# Patient Record
Sex: Female | Born: 1985 | Race: Black or African American | Hispanic: No | Marital: Single | Smoking: Never smoker
Health system: Southern US, Community
[De-identification: ages and names within clinical notes are randomized; demographics above are authoritative.]

## PROBLEM LIST (undated history)

## (undated) DIAGNOSIS — F909 Attention-deficit hyperactivity disorder, unspecified type: Secondary | ICD-10-CM

## (undated) DIAGNOSIS — Z8759 Personal history of other complications of pregnancy, childbirth and the puerperium: Secondary | ICD-10-CM

## (undated) DIAGNOSIS — J45909 Unspecified asthma, uncomplicated: Secondary | ICD-10-CM

## (undated) HISTORY — DX: Attention-deficit hyperactivity disorder, unspecified type: F90.9

## (undated) HISTORY — PX: TUMOR REMOVAL: SHX12

## (undated) HISTORY — PX: INDUCED ABORTION: SHX677

## (undated) HISTORY — DX: Unspecified asthma, uncomplicated: J45.909

## (undated) HISTORY — PX: DILATION AND CURETTAGE OF UTERUS: SHX78

## (undated) HISTORY — DX: Personal history of other complications of pregnancy, childbirth and the puerperium: Z87.59

---

## 2014-03-13 LAB — LIPID PANEL
Cholesterol: 186 mg/dL (ref 0–200)
HDL: 54 mg/dL (ref 35–70)
LDL CALC: 112 mg/dL

## 2014-03-13 LAB — CBC AND DIFFERENTIAL
HEMOGLOBIN: 12.2 g/dL (ref 12.0–16.0)
PLATELETS: 319 10*3/uL (ref 150–399)
WBC: 5.7 10*3/mL

## 2014-03-13 LAB — BASIC METABOLIC PANEL
Creatinine: 0.9 mg/dL (ref 0.5–1.1)
Glucose: 83 mg/dL
Potassium: 4.1 mmol/L (ref 3.4–5.3)
Sodium: 139 mmol/L (ref 137–147)

## 2014-03-13 LAB — HEPATIC FUNCTION PANEL
ALT: 20 U/L (ref 7–35)
AST: 11 U/L — AB (ref 13–35)

## 2015-01-14 LAB — CBC AND DIFFERENTIAL
HEMATOCRIT: 34 % — AB (ref 36–46)
Hemoglobin: 11.5 g/dL — AB (ref 12.0–16.0)
PLATELETS: 298 10*3/uL (ref 150–399)
WBC: 4.6 10^3/mL

## 2015-01-14 LAB — BASIC METABOLIC PANEL
CREATININE: 0.9 mg/dL (ref 0.5–1.1)
Glucose: 72 mg/dL

## 2015-11-12 ENCOUNTER — Encounter: Payer: Self-pay | Admitting: Osteopathic Medicine

## 2015-11-12 ENCOUNTER — Ambulatory Visit (INDEPENDENT_AMBULATORY_CARE_PROVIDER_SITE_OTHER): Payer: 59 | Admitting: Osteopathic Medicine

## 2015-11-12 VITALS — BP 127/69 | HR 64 | Ht 67.0 in | Wt 171.0 lb

## 2015-11-12 DIAGNOSIS — R233 Spontaneous ecchymoses: Secondary | ICD-10-CM

## 2015-11-12 DIAGNOSIS — Z8709 Personal history of other diseases of the respiratory system: Secondary | ICD-10-CM | POA: Diagnosis not present

## 2015-11-12 DIAGNOSIS — R238 Other skin changes: Secondary | ICD-10-CM

## 2015-11-12 DIAGNOSIS — N920 Excessive and frequent menstruation with regular cycle: Secondary | ICD-10-CM

## 2015-11-12 DIAGNOSIS — Z8659 Personal history of other mental and behavioral disorders: Secondary | ICD-10-CM | POA: Diagnosis not present

## 2015-11-12 DIAGNOSIS — Z113 Encounter for screening for infections with a predominantly sexual mode of transmission: Secondary | ICD-10-CM | POA: Diagnosis not present

## 2015-11-12 MED ORDER — LISDEXAMFETAMINE DIMESYLATE 60 MG PO CAPS: 60.0000 mg | ORAL_CAPSULE | ORAL | 0 refills | Status: DC

## 2015-11-12 MED ORDER — ALBUTEROL SULFATE HFA 108 (90 BASE) MCG/ACT IN AERS: 1.0000 | INHALATION_SPRAY | Freq: Four times a day (QID) | RESPIRATORY_TRACT | 6 refills | Status: DC | PRN

## 2015-11-12 MED ORDER — BUDESONIDE-FORMOTEROL FUMARATE 160-4.5 MCG/ACT IN AERO: 2.0000 | INHALATION_SPRAY | Freq: Two times a day (BID) | RESPIRATORY_TRACT | 6 refills | Status: DC

## 2015-11-12 NOTE — Progress Notes (Signed)
HPI: Hannah Martin is a 30 y.o. female  who presents to [place] today, 11/12/15,  for chief complaint of:  Chief Complaint  Patient presents with  . Establish Care      Easy bruising - stopped eating red meat, heavy regular periods.   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 [place] Controlled Substance Database reviewed. Has been on this for maybe a year and 3 months. Take it usually on work days, last dose was yesterday.     Past medical, surgical, social and family history reviewed: Past Medical History:  Diagnosis Date  . Asthma    History reviewed. No pertinent surgical history. Social History  Substance Use Topics  . Smoking status: Never Smoker  . Smokeless tobacco: Never Used  . Alcohol use Not on file   Family History  Problem Relation Age of Onset  . Diabetes Mother   . Anemia Mother   . Heart attack Maternal Grandmother      Current medication list and allergy/intolerance information reviewed:   Current Outpatient Prescriptions  Medication Sig Dispense Refill  . albuterol (PROVENTIL HFA;VENTOLIN HFA) 108 (90 Base) MCG/ACT inhaler Inhale into the lungs every 6 (six) hours as needed for wheezing or shortness of breath.    . budesonide-formoterol (SYMBICORT) 160-4.5 MCG/ACT inhaler Inhale 2 puffs into the lungs 2 (two) times daily.    Marland Kitchen lisdexamfetamine (VYVANSE) 60 MG capsule Take 60 mg by mouth every morning.     No current facility-administered medications for this visit.    No Known Allergies    Review of Systems:  Constitutional:  No  fever, no chills, No recent illness, No unintentional weight changes. No significant fatigue.   HEENT: No  headache, no vision change, no hearing change, No sore throat, No  sinus pressure  Cardiac: No  chest pain, No  pressure, No palpitations, No  Orthopnea  Respiratory:  No  shortness of breath. No   Cough  Gastrointestinal: No  abdominal pain, No  nausea, No  vomiting,  No  blood in stool, No  diarrhea, No  constipation   Musculoskeletal: No new myalgia/arthralgia  Genitourinary: No  incontinence, No  abnormal genital bleeding, No abnormal genital discharge  Skin: No  Rash, No other wounds/concerning lesions  Hem/Onc: No  easy bruising/bleeding, No  abnormal lymph node  Endocrine: No cold intolerance,  No heat intolerance. No polyuria/polydipsia/polyphagia   Neurologic: No  weakness, No  dizziness, No  slurred speech/focal weakness/facial droop  Psychiatric: No  concerns with depression, No  concerns with anxiety, No sleep problems, No mood problems  Exam:  BP 127/69   Pulse 64   Ht '5\' 7"'  (1.702 m)   Wt 171 lb (77.6 kg)   BMI 26.78 kg/m   Constitutional: VS see above. General Appearance: alert, well-developed, well-nourished, NAD  Eyes: Normal lids and conjunctive, non-icteric sclera  Ears, Nose, Mouth, Throat: MMM, Normal external inspection ears/nares/mouth/lips/gums. TM normal bilaterally. Pharynx/tonsils no erythema, no exudate. Nasal mucosa normal.   Neck: No masses, trachea midline. No thyroid enlargement. No tenderness/mass appreciated. No lymphadenopathy  Respiratory: Normal respiratory effort. no wheeze, no rhonchi, no rales  Cardiovascular: S1/S2 normal, no murmur, no rub/gallop auscultated. RRR. No lower extremity edema. Pedal pulse II/IV bilaterally DP and PT. No carotid bruit or JVD. No abdominal aortic bruit.  Gastrointestinal: Nontender, no masses. No hepatomegaly, no splenomegaly. No hernia appreciated. Bowel sounds normal. Rectal exam  deferred.   Musculoskeletal: Gait normal. No clubbing/cyanosis of digits.   Neurological: No cranial nerve deficit on limited exam. Motor and sensation intact and symmetric. Cerebellar reflexes intact. Normal balance/coordination. No tremor.   Skin: warm, dry, intact. No rash/ulcer. No concerning nevi or subq nodules on  limited exam.    Psychiatric: Normal judgment/insight. Normal mood and affect. Oriented x3.    Results for orders placed or performed in visit on 11/12/15 (from the past 72 hour(s))  GC/Chlamydia Probe Amp     Status: None   Collection Time: 11/12/15  3:23 PM  Result Value Ref Range   CT Probe RNA NOT DETECTED     Comment:                    **Normal Reference Range: NOT DETECTED**   This test was performed using the APTIMA COMBO2 Assay ([place].).   The analytical performance characteristics of this assay, when used to test SurePath specimens have been determined by [facility]      GC Probe RNA NOT DETECTED     Comment:                    **Normal Reference Range: NOT DETECTED**   This test was performed using the APTIMA COMBO2 Assay ([place].).   The analytical performance characteristics of this assay, when used to test SurePath specimens have been determined by [facility]     CBC with Differential/Platelet     Status: None   Collection Time: 11/12/15  3:25 PM  Result Value Ref Range   WBC 4.0 3.8 - 10.8 K/uL   RBC 4.37 3.80 - 5.10 MIL/uL   Hemoglobin 12.5 11.7 - 15.5 g/dL   HCT 37.6 35.0 - 45.0 %   MCV 86.0 80.0 - 100.0 fL   MCH 28.6 27.0 - 33.0 pg   MCHC 33.2 32.0 - 36.0 g/dL   RDW 13.3 11.0 - 15.0 %   Platelets 295 140 - 400 K/uL   MPV 9.9 7.5 - 12.5 fL   Neutro Abs 1,520 1,500 - 7,800 cells/uL   Lymphs Abs 1,920 850 - 3,900 cells/uL   Monocytes Absolute 360 200 - 950 cells/uL   Eosinophils Absolute 160 15 - 500 cells/uL   Basophils Absolute 40 0 - 200 cells/uL   Neutrophils Relative % 38 %   Monocytes Relative 9 %   Eosinophils Relative 4 %   Basophils Relative 1 %   Smear Review Criteria for review not met     Comment: ** Please note change in unit of measure and reference range(s). **  COMPLETE METABOLIC PANEL WITH GFR     Status: None   Collection Time: 11/12/15  3:25 PM  Result Value Ref Range   Sodium 139 135 - 146 mmol/L    Potassium 4.0 3.5 - 5.3 mmol/L   Chloride 108 98 - 110 mmol/L   CO2 23 20 - 31 mmol/L   Glucose, Bld 72 65 - 99 mg/dL   BUN 16 7 - 25 mg/dL   Creat 0.89 0.50 - 1.10 mg/dL   Total Bilirubin 0.9 0.2 - 1.2 mg/dL   Alkaline Phosphatase 46 33 - 115 U/L   AST 13 10 - 30 U/L   ALT 14 6 - 29 U/L   Total Protein 7.0 6.1 - 8.1 g/dL   Albumin 4.3 3.6 - 5.1 g/dL   Calcium 9.7 8.6 - 10.2 mg/dL   GFR, Est African American >89 >=60 mL/min  GFR, Est Non African American 88 >=60 mL/min  TSH     Status: None   Collection Time: 11/12/15  3:25 PM  Result Value Ref Range   TSH 0.44 mIU/L    Comment:   Reference Range   > or = 20 Years  0.40-4.50   Pregnancy Range First trimester  0.26-2.66 Second trimester 0.55-2.73 Third trimester  0.43-2.91     HIV antibody     Status: None   Collection Time: 11/12/15  3:25 PM  Result Value Ref Range   HIV 1&2 Ab, 4th Generation NONREACTIVE NONREACTIVE    Comment:   HIV-1 antigen and HIV-1/HIV-2 antibodies were not detected.  There is no laboratory evidence of HIV infection.   HIV-1/2 Antibody Diff        Not indicated. HIV-1 RNA, Qual TMA          Not indicated.     PLEASE NOTE: This information has been disclosed to you from records whose confidentiality may be protected by state law. If your state requires such protection, then the state law prohibits you from making any further disclosure of the information without the specific written consent of the person to whom it pertains, or as otherwise permitted by law. A general authorization for the release of medical or other information is NOT sufficient for this purpose.   The performance of this assay has not been clinically validated in patients less than 80 years old.   For additional information please refer to http://education.[facility].com/faq/FAQ106.  (This link is being provided for informational/educational purposes only.)     RPR     Status: None   Collection Time: 11/12/15   3:25 PM  Result Value Ref Range   RPR Ser Ql NON REAC NON REAC  Protime-INR     Status: None   Collection Time: 11/12/15  3:25 PM  Result Value Ref Range   Prothrombin Time 10.7 9.0 - 11.5 sec    Comment:   For more information on this test, go to: http://education.[facility].com/faq/FAQ104      INR 1.0     Comment:   Reference Range                        0.9-1.1 Moderate-intensity Warfarin Therapy    2.0-3.0 Higher-intensity Warfarin Therapy      3.0-4.0     PTT     Status: None   Collection Time: 11/12/15  3:25 PM  Result Value Ref Range   aPTT 31 22 - 34 sec    Comment:   This test has not been validated for monitoring unfractionated heparin therapy. For testing that is validated for this type of therapy, please refer to the Heparin Anti-Xa assay (test code [phone]).   For additional information, please refer to http://education.[facility].com/faq/FAQ159 (This link is being provided for informational/educational purposes only.)     Trichomonas vaginalis, RNA     Status: None   Collection Time: 11/12/15  4:13 PM  Result Value Ref Range   T vaginalis RNA Not Detected     Comment:                   ** Normal Reference Range: Not Detected **   Assay performed by the FDA Approved GenProbe APTIMA Trichomonas vaginalis kit.   Effective November 04, 2015, the general Trichomonas test code [id number] (Trichomonas vaginalis, RNA) will be inactivated. Please refer to the following new codes when ordering Trichomonas testing:   - 89169 (SureSwab,  T. vaginalis, RNA, Female) for female collections - [phone] (Trichomonas vaginalis, RNA, Female) for female collections - [phone] (T. vaginalis RNA, PAP Vial) for PAP vial collections   All new test codes are currently available for use. If you have any questions, please contact your [facility] Account Representative directly, or call our Customer Service Department at [phone].       No results  found.   ASSESSMENT/PLAN:   History of ADHD - Plan: lisdexamfetamine (VYVANSE) 60 MG capsule  History of asthma - Plan: budesonide-formoterol (SYMBICORT) 160-4.5 MCG/ACT inhaler, albuterol (PROVENTIL HFA;VENTOLIN HFA) 108 (90 Base) MCG/ACT inhaler  Routine screening for STI (sexually transmitted infection) - Plan: HIV antibody, RPR, GC/Chlamydia Probe Amp, Trichomonas vaginalis, RNA, CANCELED: GC/chlamydia probe amp, urine, CANCELED: Trichomonas vaginalis, RNA  Easy bruising - Plan: COMPLETE METABOLIC PANEL WITH GFR, Protime-INR, PTT  Menorrhagia with regular cycle - Plan: CBC with Differential/Platelet, TSH     Visit summary with medication list and pertinent instructions was printed for patient to review. All questions at time of visit were answered - patient instructed to contact office with any additional concerns. ER/RTC precautions were reviewed with the patient. Follow-up plan: Return in about 3 months (around 02/12/2016), or sooner if needed, for [place] - please call ahead and make sure we have received your records.

## 2015-11-12 NOTE — Patient Instructions (Addendum)
Heavy periods - try OTC Ibuprofen 400 - 800 mg three times per day, or Aleve 220 mg twice per day, starting a day or two before your period, and continuing until bleeding gets light.   Asthma - if you get winded during exercise, try Albuterol inhaler a few minutes before your workout.     DR. Mardelle MatteALEXANDER'S IMPORTANT  INFORMATION FOR NEW PATIENTS!  PLEASE REVIEW CAREFULLY!    FOLLOW-UP!   Let's plan to follow-up in the office at your convenience for annual physical / wellnes check once I have previous doctor's records.   Long term, let's plan to follow-up every 3-4 months for management/monitoring of chronic medical issues such as ADD and Asthma, as well as management of your prescription medications.   Please don't hesitate to make an appointment sooner if you're having any acute concerns or problems!  REGARDING PRESCRIPTION MEDICATIONS  Please let your pharmacy know when you are running low on medications/refills (do not wait until you are out of medicines). Your pharmacy will send our office a request for the appropriate medications. Please allow our office 2-3 business days to process the needed refills.   For controlled substances, please read the following carefully:  You may be asked to schedule an office visit and/or undergo a random urine drug screen for continuation of such medications.   You may be asked to come to the office to pick up refills once per month. We are not able to fax or give automatic refills on many of these types of prescriptions.   If you have signed a controlled substance contract, you are responsible for adhering to the rules of that contract, and you have been provided with a copy of that contract.   REGARDING ANY CARE YOU RECEIVE OUTSIDE OUR OFFICE  At any visits to any specialists, or if you receive vaccines anywhere outside our office, please provide our clinic information so that they can forward us any records, including any tests which are done,  changes to your medications, or new vaccinations.   Also, if you are ever treated in an emergency room or if you are admitted to the hospital, please contact our office after your discharge. We encourage our patients to schedule a follow-up visit with their PCP any time they are treated for a serious illness or injury.   This allows all your physicians to communicate effectively, putting your primary care doctor at the center of your medical care and allowing us to effectively coordinate your care.  REGARDING PREVENTIVE CARE & WELLNESS, AKA "ANNUAL PHYSICAL"  Let's plan to follow-up here in the office in 3 - 4 months for ANNUAL WELLNESS & PREVENTIVE CARE PHYSICAL.   This visit is very important to make sure we talk with our patients about all recommended cancer screenings, vaccines, birth control (if desired) and screenings for other diseases based on your personal/family history. Plus, this visit should be completely covered under your insurance!   We also like to talk about any changes to your health over the past year and make sure any chronic conditions are well-controlled.    If you would like to, you can get routine lab work done 2 - 3 days before your physical so that we can go over the results in person at your appointment. Please call our office a week before that appointment so we can make sure the lab has the appropriate orders for your blood draw.   Please note: insurance should completely cover one preventive care visit annually,  and they should completely cover most tests associated with preventive care such as routine labs, mammograms, etc. If you have any other medical concerns to address, you may be asked to reschedule your annual physical, or schedule a separate visit to address other medical concerns. Or, you may be billed for care related to "problem-based visit" in addition to your "preventive care visit." If you have questions about this, please contact our office, your  insurance company, or [facility] billing department.   REGARDING ANY FORMS NEEDING YOUR DOCTOR'S SIGNATURE  If you ever have any paperwork which needs to be completed by your PCP, you may be asked to come to the office if that paperwork requires a complex review of your medical history - we want to make sure everything is completed to your satisfaction and is completed correctly the first time, particularly with FMLA or other employment/legal matters!    Please let us know if there is anything else we can do for you. Take care! -Dr. Mervyn SkeetersA.

## 2015-11-13 DIAGNOSIS — R238 Other skin changes: Secondary | ICD-10-CM | POA: Insufficient documentation

## 2015-11-13 DIAGNOSIS — R233 Spontaneous ecchymoses: Secondary | ICD-10-CM | POA: Insufficient documentation

## 2015-11-13 DIAGNOSIS — N920 Excessive and frequent menstruation with regular cycle: Secondary | ICD-10-CM | POA: Insufficient documentation

## 2015-11-13 DIAGNOSIS — Z113 Encounter for screening for infections with a predominantly sexual mode of transmission: Secondary | ICD-10-CM | POA: Insufficient documentation

## 2015-11-13 DIAGNOSIS — Z0000 Encounter for general adult medical examination without abnormal findings: Secondary | ICD-10-CM | POA: Insufficient documentation

## 2015-11-13 LAB — CBC WITH DIFFERENTIAL/PLATELET
Basophils Absolute: 40 {cells}/uL (ref 0–200)
Basophils Relative: 1 %
Eosinophils Absolute: 160 {cells}/uL (ref 15–500)
Eosinophils Relative: 4 %
HCT: 37.6 % (ref 35.0–45.0)
Hemoglobin: 12.5 g/dL (ref 11.7–15.5)
Lymphs Abs: 1920 {cells}/uL (ref 850–3900)
MCH: 28.6 pg (ref 27.0–33.0)
MCHC: 33.2 g/dL (ref 32.0–36.0)
MCV: 86 fL (ref 80.0–100.0)
MPV: 9.9 fL (ref 7.5–12.5)
Monocytes Absolute: 360 {cells}/uL (ref 200–950)
Monocytes Relative: 9 %
Neutro Abs: 1520 {cells}/uL (ref 1500–7800)
Neutrophils Relative %: 38 %
Platelets: 295 K/uL (ref 140–400)
RBC: 4.37 MIL/uL (ref 3.80–5.10)
RDW: 13.3 % (ref 11.0–15.0)
WBC: 4 K/uL (ref 3.8–10.8)

## 2015-11-13 LAB — COMPLETE METABOLIC PANEL WITHOUT GFR
ALT: 14 U/L (ref 6–29)
AST: 13 U/L (ref 10–30)
Albumin: 4.3 g/dL (ref 3.6–5.1)
Alkaline Phosphatase: 46 U/L (ref 33–115)
BUN: 16 mg/dL (ref 7–25)
CO2: 23 mmol/L (ref 20–31)
Calcium: 9.7 mg/dL (ref 8.6–10.2)
Chloride: 108 mmol/L (ref 98–110)
Creat: 0.89 mg/dL (ref 0.50–1.10)
GFR, Est African American: >89 mL/min
GFR, Est Non African American: 88 mL/min
Glucose, Bld: 72 mg/dL (ref 65–99)
Potassium: 4 mmol/L (ref 3.5–5.3)
Sodium: 139 mmol/L (ref 135–146)
Total Bilirubin: 0.9 mg/dL (ref 0.2–1.2)
Total Protein: 7 g/dL (ref 6.1–8.1)

## 2015-11-13 LAB — GC/CHLAMYDIA PROBE AMP
CT Probe RNA: NOT DETECTED
GC PROBE AMP APTIMA: NOT DETECTED

## 2015-11-13 LAB — HIV ANTIBODY (ROUTINE TESTING W REFLEX): HIV: NONREACTIVE

## 2015-11-13 LAB — TRICHOMONAS VAGINALIS, PROBE AMP: T vaginalis RNA: NOT DETECTED

## 2015-11-13 LAB — PROTIME-INR
INR: 1
PROTHROMBIN TIME: 10.7 s (ref 9.0–11.5)

## 2015-11-13 LAB — SYPHILIS: RPR W/REFLEX TO RPR TITER AND TREPONEMAL ANTIBODIES, TRADITIONAL SCREENING AND DIAGNOSIS ALGORITHM

## 2015-11-13 LAB — APTT: aPTT: 31 s (ref 22–34)

## 2015-11-13 LAB — TSH: TSH: 0.44 m[IU]/L

## 2015-11-13 NOTE — Assessment & Plan Note (Signed)
Contract signed and sent to scan

## 2015-12-26 ENCOUNTER — Other Ambulatory Visit: Payer: Self-pay

## 2015-12-26 DIAGNOSIS — Z8659 Personal history of other mental and behavioral disorders: Secondary | ICD-10-CM

## 2015-12-26 MED ORDER — LISDEXAMFETAMINE DIMESYLATE 60 MG PO CAPS: 60.0000 mg | ORAL_CAPSULE | ORAL | 0 refills | Status: DC

## 2015-12-30 ENCOUNTER — Encounter: Payer: Self-pay | Admitting: Osteopathic Medicine

## 2016-01-31 ENCOUNTER — Other Ambulatory Visit: Payer: Self-pay

## 2016-01-31 DIAGNOSIS — Z8659 Personal history of other mental and behavioral disorders: Secondary | ICD-10-CM

## 2016-01-31 MED ORDER — LISDEXAMFETAMINE DIMESYLATE 60 MG PO CAPS: 60.0000 mg | ORAL_CAPSULE | ORAL | 0 refills | Status: DC

## 2016-03-16 ENCOUNTER — Ambulatory Visit: Payer: 59 | Admitting: Osteopathic Medicine

## 2016-03-18 ENCOUNTER — Encounter: Payer: Self-pay | Admitting: Osteopathic Medicine

## 2016-03-18 ENCOUNTER — Ambulatory Visit (INDEPENDENT_AMBULATORY_CARE_PROVIDER_SITE_OTHER): Payer: 59 | Admitting: Osteopathic Medicine

## 2016-03-18 DIAGNOSIS — Z8659 Personal history of other mental and behavioral disorders: Secondary | ICD-10-CM

## 2016-03-18 MED ORDER — MONTELUKAST SODIUM 10 MG PO TABS: 10.0000 mg | ORAL_TABLET | Freq: Every day | ORAL | 11 refills | Status: DC

## 2016-03-18 MED ORDER — LISDEXAMFETAMINE DIMESYLATE 60 MG PO CAPS: 60.0000 mg | ORAL_CAPSULE | ORAL | 0 refills | Status: DC

## 2016-03-18 NOTE — Progress Notes (Signed)
HPI: Hannah Martin is a 30 y.o. female  who presents [facility] today, 03/18/16,  for chief complaint of:  Chief Complaint  Patient presents with  . Follow-up    ADHD    Requests refill Rx for Vyvanse.. [place] Controlled Substance Database reviewed. Consistent with monthly refills from me. No concerns on medications as far as palpitations, insomnia, unintentional weight changes   Past medical, surgical, social and family history reviewed: Past Medical History:  Diagnosis Date  . Asthma    No past surgical history on file. Social History  Substance Use Topics  . Smoking status: Never Smoker  . Smokeless tobacco: Never Used  . Alcohol use Not on file   Family History  Problem Relation Age of Onset  . Diabetes Mother   . Anemia Mother   . Heart attack Maternal Grandmother      Current medication list and allergy/intolerance information reviewed:   Current Outpatient Prescriptions  Medication Sig Dispense Refill  . albuterol (PROVENTIL HFA;VENTOLIN HFA) 108 (90 Base) MCG/ACT inhaler Inhale 1-2 puffs into the lungs every 6 (six) hours as needed for wheezing or shortness of breath. 1 Inhaler 6  . budesonide-formoterol (SYMBICORT) 160-4.5 MCG/ACT inhaler Inhale 2 puffs into the lungs 2 (two) times daily. During severe allergy season or acute illness 1 Inhaler 6  . lisdexamfetamine (VYVANSE) 60 MG capsule Take 1 capsule (60 mg total) by mouth every morning. 30 capsule 0   No current facility-administered medications for this visit.    No Known Allergies    Review of Systems:  Constitutional:  No  fever, no chills, No recent illness, No unintentional weight changes.   HEENT: No  headache  Cardiac: No  chest pain, No  pressure, No palpitations  Respiratory:  No  shortness of breath.  Neurologic: No  weakness, No  dizziness,  Psychiatric: No  concerns with depression, No  concerns with anxiety, No sleep problems, No mood  problems  Exam:  BP 132/71   Pulse 83   Ht 5' 6.5" (1.689 m)   Wt 171 lb (77.6 kg)   BMI 27.19 kg/m   Constitutional: VS see above. General Appearance: alert, well-developed, well-nourished, NAD  Neck: No masses, trachea midline.  Respiratory: Normal respiratory effort.  Cardiovascular: S1/S2 normal, no murmur, no rub/gallop auscultated. RRR  Psychiatric: Normal judgment/insight. Normal mood and affect. Oriented x3.      ASSESSMENT/PLAN: 3 months supply provided  History of ADHD - Plan: lisdexamfetamine (VYVANSE) 60 MG capsule, lisdexamfetamine (VYVANSE) 60 MG capsule, DISCONTINUED: lisdexamfetamine (VYVANSE) 60 MG capsule, DISCONTINUED: lisdexamfetamine (VYVANSE) 60 MG capsule, DISCONTINUED: lisdexamfetamine (VYVANSE) 60 MG capsule     Visit summary with medication list and pertinent instructions was printed for patient to review. All questions at time of visit were answered - patient instructed to contact office with any additional concerns. ER/RTC precautions were reviewed with the patient. Follow-up plan: Return in about 3 months (around 06/16/2016) for REFILL MEDICATIONS .

## 2016-06-24 ENCOUNTER — Ambulatory Visit (INDEPENDENT_AMBULATORY_CARE_PROVIDER_SITE_OTHER): Payer: 59 | Admitting: Osteopathic Medicine

## 2016-06-24 ENCOUNTER — Encounter: Payer: Self-pay | Admitting: Osteopathic Medicine

## 2016-06-24 VITALS — BP 137/84 | HR 88 | Ht 66.0 in | Wt 174.7 lb

## 2016-06-24 DIAGNOSIS — F988 Other specified behavioral and emotional disorders with onset usually occurring in childhood and adolescence: Secondary | ICD-10-CM | POA: Diagnosis not present

## 2016-06-24 MED ORDER — LISDEXAMFETAMINE DIMESYLATE 60 MG PO CAPS: 60.0000 mg | ORAL_CAPSULE | ORAL | 0 refills | Status: DC

## 2016-06-24 NOTE — Progress Notes (Signed)
HPI: Hannah Martin is a 31 y.o. female  who presents to [facility] today, 06/24/16,  for chief complaint of:  Chief Complaint  Patient presents with  . ADHD    Requests refill Rx for Vyvanse.. [place] Controlled Substance Database reviewed. Consistent with monthly refills from me. No concerns on medications as far as palpitations, unintentional weight changes. Insomnia sometimes when work schedule requires her to take Rx later in the day but it's not too bothersome.    Past medical, surgical, social and family history reviewed: Past Medical History:  Diagnosis Date  . Asthma    No past surgical history on file. Social History  Substance Use Topics  . Smoking status: Never Smoker  . Smokeless tobacco: Never Used  . Alcohol use Not on file   Family History  Problem Relation Age of Onset  . Diabetes Mother   . Anemia Mother   . Heart attack Maternal Grandmother      Current medication list and allergy/intolerance information reviewed:   Current Outpatient Prescriptions  Medication Sig Dispense Refill  . albuterol (PROVENTIL HFA;VENTOLIN HFA) 108 (90 Base) MCG/ACT inhaler Inhale 1-2 puffs into the lungs every 6 (six) hours as needed for wheezing or shortness of breath. 1 Inhaler 6  . budesonide-formoterol (SYMBICORT) 160-4.5 MCG/ACT inhaler Inhale 2 puffs into the lungs 2 (two) times daily. During severe allergy season or acute illness 1 Inhaler 6  . lisdexamfetamine (VYVANSE) 60 MG capsule Take 1 capsule (60 mg total) by mouth every morning. 30 capsule 0  . lisdexamfetamine (VYVANSE) 60 MG capsule Take 1 capsule (60 mg total) by mouth every morning. 30 capsule 0  . montelukast (SINGULAIR) 10 MG tablet Take 1 tablet (10 mg total) by mouth at bedtime. 30 tablet 11   No current facility-administered medications for this visit.    No Known Allergies    Review of Systems:  Constitutional:  No  fever, no chills, No recent illness, No  unintentional weight changes.   HEENT: No  headache  Cardiac: No  chest pain, No  pressure, No palpitations  Respiratory:  No  shortness of breath.  Neurologic: No  weakness, No  dizziness,  Psychiatric: No  concerns with depression, No  concerns with anxiety, No sleep problems, No mood problems  Exam:  BP 137/84   Pulse 88   Ht 5\' 6"  (1.676 m)   Wt 174 lb 11.2 oz (79.2 kg)   LMP 05/22/2016 (Approximate)   SpO2 100%   BMI 28.20 kg/m   Constitutional: VS see above. General Appearance: alert, well-developed, well-nourished, NAD  Respiratory: Normal respiratory effort.  Psychiatric: Normal judgment/insight. Normal mood and affect. Oriented x3.     ASSESSMENT/PLAN: 3 months supply provided. Discussed change dose to lower to avoid insomnia but pt isn't bothered too much and only has this issue with taking medsl later in the day.   Attention deficit disorder (ADD) in adult - Plan: lisdexamfetamine (VYVANSE) 60 MG capsule    Visit summary with medication list and pertinent instructions was printed for patient to review. All questions at time of visit were answered - patient instructed to contact office with any additional concerns. ER/RTC precautions were reviewed with the patient. Follow-up plan: Return in about 3 months (around 09/24/2016) for ADHD MEDICATION REFILL - sooner if need .

## 2016-06-25 ENCOUNTER — Encounter: Payer: 59 | Admitting: Osteopathic Medicine

## 2016-07-02 ENCOUNTER — Encounter: Payer: 59 | Admitting: Osteopathic Medicine

## 2016-09-22 ENCOUNTER — Ambulatory Visit: Payer: 59 | Admitting: Osteopathic Medicine

## 2016-09-22 DIAGNOSIS — Z0189 Encounter for other specified special examinations: Secondary | ICD-10-CM

## 2016-10-14 ENCOUNTER — Ambulatory Visit: Payer: 59 | Admitting: Osteopathic Medicine

## 2016-10-14 DIAGNOSIS — Z0189 Encounter for other specified special examinations: Secondary | ICD-10-CM

## 2016-10-15 ENCOUNTER — Ambulatory Visit (INDEPENDENT_AMBULATORY_CARE_PROVIDER_SITE_OTHER): Payer: 59 | Admitting: Osteopathic Medicine

## 2016-10-15 ENCOUNTER — Encounter: Payer: Self-pay | Admitting: Osteopathic Medicine

## 2016-10-15 DIAGNOSIS — F988 Other specified behavioral and emotional disorders with onset usually occurring in childhood and adolescence: Secondary | ICD-10-CM

## 2016-10-15 MED ORDER — LISDEXAMFETAMINE DIMESYLATE 60 MG PO CAPS: 60.0000 mg | ORAL_CAPSULE | ORAL | 0 refills | Status: DC

## 2016-10-15 NOTE — Progress Notes (Signed)
HPI: Hannah Martin is a 31 y.o. female  who presents [facility], 10/15/16,  for chief complaint of:  Chief Complaint  Patient presents with  . ADD    Routine visit for refill Rx for Vyvanse. [place] Controlled Substance Database reviewed. Consistent with monthly refills from me, last filled 09/14/16. No concerns on medications as far as palpitations, unintentional weight changes.   Past medical, surgical, social and family history reviewed: Past Medical History:  Diagnosis Date  . Asthma    No past surgical history on file. Social History  Substance Use Topics  . Smoking status: Never Smoker  . Smokeless tobacco: Never Used  . Alcohol use Not on file   Family History  Problem Relation Age of Onset  . Diabetes Mother   . Anemia Mother   . Heart attack Maternal Grandmother      Current medication list and allergy/intolerance information reviewed:   Current Outpatient Prescriptions  Medication Sig Dispense Refill  . albuterol (PROVENTIL HFA;VENTOLIN HFA) 108 (90 Base) MCG/ACT inhaler Inhale 1-2 puffs into the lungs every 6 (six) hours as needed for wheezing or shortness of breath. 1 Inhaler 6  . budesonide-formoterol (SYMBICORT) 160-4.5 MCG/ACT inhaler Inhale 2 puffs into the lungs 2 (two) times daily. During severe allergy season or acute illness 1 Inhaler 6  . lisdexamfetamine (VYVANSE) 60 MG capsule Take 1 capsule (60 mg total) by mouth every morning. (Patient not taking: Reported on 06/24/2016) 30 capsule 0  . montelukast (SINGULAIR) 10 MG tablet Take 1 tablet (10 mg total) by mouth at bedtime. 30 tablet 11   No current facility-administered medications for this visit.    No Known Allergies    Review of Systems:  Constitutional:  No  fever, no chills, No recent illness, No unintentional weight changes.   HEENT: No  headache  Cardiac: No  chest pain, No  pressure, No palpitations  Respiratory:  No  shortness of  breath.  Neurologic: No  weakness, No  dizziness,  Psychiatric: No  concerns with depression, No  concerns with anxiety, No sleep problems, No mood problems  Exam:  BP 123/77   Pulse 79   Wt 174 lb (78.9 kg)   SpO2 98%   BMI 28.08 kg/m   Constitutional: VS see above. General Appearance: alert, well-developed, well-nourished, NAD  Respiratory: Normal respiratory effort. CTABL  CV: RRR, S1S2 normal,   Psychiatric: Normal judgment/insight. Normal mood and affect. Oriented x3.     ASSESSMENT/PLAN:   3 months supply provided. She has been getting these Rx q3 mos diligently for 1 year and no concerns from me.   Note for staff: I'm OK to refill without visit but needs appt in 6 months - can route refill requests to me.   Attention deficit disorder (ADD) in adult - Plan: lisdexamfetamine (VYVANSE) 60 MG capsule, lisdexamfetamine (VYVANSE) 60 MG capsule, lisdexamfetamine (VYVANSE) 60 MG capsule    Visit summary with medication list and pertinent instructions was printed for patient to review. All questions at time of visit were answered - patient instructed to contact office with any additional concerns. ER/RTC precautions were reviewed with the patient. Follow-up plan: Return for annual/ pap and ADHD medication refill next 3-6 months .

## 2017-01-15 ENCOUNTER — Ambulatory Visit: Payer: 59 | Admitting: Osteopathic Medicine

## 2017-01-15 DIAGNOSIS — Z0189 Encounter for other specified special examinations: Secondary | ICD-10-CM

## 2017-01-21 ENCOUNTER — Encounter: Payer: Self-pay | Admitting: Osteopathic Medicine

## 2017-01-21 ENCOUNTER — Other Ambulatory Visit (HOSPITAL_COMMUNITY)
Admission: RE | Admit: 2017-01-21 | Discharge: 2017-01-21 | Disposition: A | Discharge status: Home / Self Care | Payer: 59 | Source: Ambulatory Visit | Attending: Family Medicine | Admitting: Family Medicine

## 2017-01-21 ENCOUNTER — Ambulatory Visit (INDEPENDENT_AMBULATORY_CARE_PROVIDER_SITE_OTHER): Payer: 59 | Admitting: Osteopathic Medicine

## 2017-01-21 VITALS — BP 127/74 | HR 81 | Ht 66.0 in | Wt 182.0 lb

## 2017-01-21 DIAGNOSIS — F988 Other specified behavioral and emotional disorders with onset usually occurring in childhood and adolescence: Secondary | ICD-10-CM

## 2017-01-21 DIAGNOSIS — Z124 Encounter for screening for malignant neoplasm of cervix: Secondary | ICD-10-CM | POA: Diagnosis not present

## 2017-01-21 DIAGNOSIS — Z0000 Encounter for general adult medical examination without abnormal findings: Secondary | ICD-10-CM | POA: Diagnosis not present

## 2017-01-21 DIAGNOSIS — Z113 Encounter for screening for infections with a predominantly sexual mode of transmission: Secondary | ICD-10-CM

## 2017-01-21 MED ORDER — LISDEXAMFETAMINE DIMESYLATE 60 MG PO CAPS: 60.0000 mg | ORAL_CAPSULE | ORAL | 0 refills | Status: DC

## 2017-01-21 NOTE — Progress Notes (Signed)
HPI: Hannah Martin is a 31 y.o. female  who presents to [facility] today, 01/21/17,  for chief complaint of:  Chief Complaint  Patient presents with  . Follow-up    ADHD  . Gynecologic Exam      Patient here for annual physical / wellness exam.  See preventive care reviewed as below.  Recent labs reviewed in detail with the patient.   Additional concerns today include:  None   Past medical, surgical, social and family history reviewed: Patient Active Problem List   Diagnosis Date Noted  . Attention deficit disorder (ADD) in adult 06/24/2016  . Menorrhagia with regular cycle 11/13/2015  . Easy bruising 11/13/2015  . Routine screening for STI (sexually transmitted infection) 11/13/2015  . History of ADHD 11/12/2015  . History of asthma 11/12/2015   No past surgical history on file. Social History  Substance Use Topics  . Smoking status: Never Smoker  . Smokeless tobacco: Never Used  . Alcohol use Not on file   Family History  Problem Relation Age of Onset  . Diabetes Mother   . Anemia Mother   . Heart attack Maternal Grandmother      Current medication list and allergy/intolerance information reviewed:   Current Outpatient Prescriptions  Medication Sig Dispense Refill  . albuterol (PROVENTIL HFA;VENTOLIN HFA) 108 (90 Base) MCG/ACT inhaler Inhale 1-2 puffs into the lungs every 6 (six) hours as needed for wheezing or shortness of breath. 1 Inhaler 6  . budesonide-formoterol (SYMBICORT) 160-4.5 MCG/ACT inhaler Inhale 2 puffs into the lungs 2 (two) times daily. During severe allergy season or acute illness 1 Inhaler 6  . lisdexamfetamine (VYVANSE) 60 MG capsule Take 1 capsule (60 mg total) by mouth every morning. 30 capsule 0  . lisdexamfetamine (VYVANSE) 60 MG capsule Take 1 capsule (60 mg total) by mouth every morning. Fill in 30 days from Rx date 30 capsule 0  . lisdexamfetamine (VYVANSE) 60 MG capsule Take 1 capsule (60 mg total)  by mouth every morning. Fill in 60 days from Rx date 30 capsule 0  . montelukast (SINGULAIR) 10 MG tablet Take 1 tablet (10 mg total) by mouth at bedtime. 30 tablet 11   No current facility-administered medications for this visit.    No Known Allergies    Review of Systems:  Constitutional:  No  fever, no chills, No recent illness, No unintentional weight changes. No significant fatigue.   HEENT: No  headache, no vision change, no hearing change, No sore throat, No  sinus pressure  Cardiac: No  chest pain, No  pressure, No palpitations, No  Orthopnea  Respiratory:  No  shortness of breath. No  Cough  Gastrointestinal: No  abdominal pain, No  nausea,  Musculoskeletal: No new myalgia/arthralgia  Genitourinary: No  incontinence, No  abnormal genital bleeding, No abnormal genital discharge  Skin: No  Rash, No other wounds/concerning lesions  Hem/Onc: No  easy bruising/bleeding  Neurologic: No  weakness, No  dizziness  Psychiatric: No  concerns with depression, No  concerns with anxiety  Exam:  BP 127/74   Pulse 81   Ht 5\' 6"  (1.676 m)   Wt 182 lb (82.6 kg)   BMI 29.38 kg/m   Constitutional: VS see above. General Appearance: alert, well-developed, well-nourished, NAD  Eyes: Normal lids and conjunctive, non-icteric sclera  Ears, Nose, Mouth, Throat: MMM, Normal external inspection ears/nares/mouth/lips/gums. TM normal bilaterally. Pharynx/tonsils no erythema, no exudate. Nasal mucosa normal.   Neck: No masses, trachea midline. No  thyroid enlargement. No tenderness/mass appreciated. No lymphadenopathy  Respiratory: Normal respiratory effort. no wheeze, no rhonchi, no rales  Cardiovascular: S1/S2 normal, no murmur, no rub/gallop auscultated. RRR. No lower extremity edema. Pedal pulse II/IV bilaterally DP and PT. No carotid bruit or JVD. No abdominal aortic bruit.  Gastrointestinal: Nontender, no masses. No hepatomegaly, no splenomegaly. No hernia appreciated. Bowel sounds  normal. Rectal exam deferred.   Musculoskeletal: Gait normal. No clubbing/cyanosis of digits.   Neurological: Normal balance/coordination. No tremor. No cranial nerve deficit on limited exam. Motor and sensation intact and symmetric. Cerebellar reflexes intact.   Skin: warm, dry, intact. No rash/ulcer. No concerning nevi or subq nodules on limited exam.    Psychiatric: Normal judgment/insight. Normal mood and affect. Oriented x3.  GYN: No lesions/ulcers to external genitalia, normal urethra, normal vaginal mucosa, physiologic discharge, cervix normal without lesions, uterus not enlarged or tender, adnexa no masses and nontender  BREAST: No rashes/skin changes, normal fibrous breast tissue, no masses or tenderness, normal nipple without discharge, normal axilla     ASSESSMENT/PLAN:   Annual physical exam - Plan: CBC, COMPLETE METABOLIC PANEL WITH GFR, Lipid panel, TSH, HIV antibody  Cervical cancer screening - Plan: Cytology - PAP  Routine screening for STI (sexually transmitted infection) - Plan: HIV antibody, WET PREP FOR TRICH, YEAST, CLUE, C. trachomatis/N. gonorrhoeae RNA, CANCELED: Wet prep, genital  Attention deficit disorder (ADD) in adult - Plan: lisdexamfetamine (VYVANSE) 60 MG capsule, lisdexamfetamine (VYVANSE) 60 MG capsule, lisdexamfetamine (VYVANSE) 60 MG capsule   FEMALE PREVENTIVE CARE Updated 01/21/17   ANNUAL SCREENING/COUNSELING  Diet/Exercise - HEALTHY HABITS DISCUSSED TO DECREASE CV RISK History  Smoking Status  . Never Smoker  Smokeless Tobacco  . Never Used   History  Alcohol use Not on file   No flowsheet data found.  Domestic violence concerns - no  HTN SCREENING - SEE VITALS  SEXUAL HEALTH  Sexually active in the past year - Yes with female.  Need/want STI testing today? - routine "just to be safe"   Concerns about libido or pain with sex? - no  Plans for pregnancy? - none at this time - f[place]e is [place], no need for [facility], discussed IUD  or other LARC given her intolerance to OCP (mood issues)   INFECTIOUS DISEASE SCREENING  HIV - does not need  GC/CT - does not need  HepC - DOB [birth year] - does not need  TB - does not need  DISEASE SCREENING  Lipid - needs  DM2 - does not need  Osteoporosis - women age 63+ - does not need  CANCER SCREENING  Cervical - needs  Breast - does not need  Lung - does not need  Colon - does not need  ADULT VACCINATION  Influenza - annual vaccine recommended  Td - booster every 10 years   Zoster - Shingrix recommended 50+  PCV13 - was not indicated  PPSV23 - was not indicated  There is no immunization history on file for this patient.      Patient Instructions  Plan:  Routine STI check and labs and Pap today  Results should NOT take more than a week to come back - let us know if you don't hear about your results or see them on MyChart in that time!   If everything is doing well, please continue to come see [place] every 3 months to refill ADHD medications, or see [place] sooner if anything else comes up!     Visit summary with medication list and  pertinent instructions was printed for patient to review. All questions at time of visit were answered - patient instructed to contact office with any additional concerns. ER/RTC precautions were reviewed with the patient. Follow-up plan: Return in about 3 months (around 04/23/2017) for ADHD refills, sooner if needed.

## 2017-01-21 NOTE — Patient Instructions (Signed)
Plan:  Routine STI check and labs and Pap today  Results should NOT take more than a week to come back - let us know if you don't hear about your results or see them on MyChart in that time!   If everything is doing well, please continue to come see us every 3 months to refill ADHD medications, or see us sooner if anything else comes up!

## 2017-01-22 LAB — TSH: TSH: 1.23 mIU/L

## 2017-01-22 LAB — COMPLETE METABOLIC PANEL WITH GFR
AG RATIO: 1.9 (calc) (ref 1.0–2.5)
ALKALINE PHOSPHATASE (APISO): 52 U/L (ref 33–115)
ALT: 9 U/L (ref 6–29)
AST: 12 U/L (ref 10–30)
Albumin: 4.3 g/dL (ref 3.6–5.1)
BUN: 18 mg/dL (ref 7–25)
CHLORIDE: 111 mmol/L — AB (ref 98–110)
CO2: 24 mmol/L (ref 20–32)
Calcium: 9.5 mg/dL (ref 8.6–10.2)
Creat: 0.96 mg/dL (ref 0.50–1.10)
GFR, Est African American: 93 mL/min/{1.73_m2} (ref >=60)
GFR, Est Non African American: 80 mL/min/{1.73_m2} (ref >=60)
GLOBULIN: 2.3 g/dL (ref 1.9–3.7)
Glucose, Bld: 76 mg/dL (ref 65–99)
POTASSIUM: 4.5 mmol/L (ref 3.5–5.3)
SODIUM: 140 mmol/L (ref 135–146)
Total Bilirubin: 0.6 mg/dL (ref 0.2–1.2)
Total Protein: 6.6 g/dL (ref 6.1–8.1)

## 2017-01-22 LAB — WET PREP FOR TRICH, YEAST, CLUE
MICRO NUMBER: 81197516
SPECIMEN QUALITY 3963: ADEQUATE

## 2017-01-22 LAB — CBC
HCT: 37.3 % (ref 35.0–45.0)
Hemoglobin: 12.7 g/dL (ref 11.7–15.5)
MCH: 29.5 pg (ref 27.0–33.0)
MCHC: 34 g/dL (ref 32.0–36.0)
MCV: 86.7 fL (ref 80.0–100.0)
MPV: 10.3 fL (ref 7.5–12.5)
PLATELETS: 346 10*3/uL (ref 140–400)
RBC: 4.3 10*6/uL (ref 3.80–5.10)
RDW: 12.3 % (ref 11.0–15.0)
WBC: 4.5 10*3/uL (ref 3.8–10.8)

## 2017-01-22 LAB — LIPID PANEL
CHOLESTEROL: 191 mg/dL (ref <200)
HDL: 71 mg/dL (ref >50)
LDL Cholesterol (Calc): 104 mg/dL (calc) — ABNORMAL HIGH
Non-HDL Cholesterol (Calc): 120 mg/dL (calc) (ref <130)
Total CHOL/HDL Ratio: 2.7 (calc) (ref <5.0)
Triglycerides: 71 mg/dL (ref <150)

## 2017-01-22 LAB — HIV ANTIBODY (ROUTINE TESTING W REFLEX): HIV 1&2 Ab, 4th Generation: NONREACTIVE

## 2017-01-22 LAB — C. TRACHOMATIS/N. GONORRHOEAE RNA
C. trachomatis RNA, TMA: NOT DETECTED
N. gonorrhoeae RNA, TMA: NOT DETECTED

## 2017-01-25 LAB — CYTOLOGY - PAP: DIAGNOSIS: NEGATIVE

## 2017-06-02 ENCOUNTER — Ambulatory Visit (INDEPENDENT_AMBULATORY_CARE_PROVIDER_SITE_OTHER): Payer: 59

## 2017-06-02 ENCOUNTER — Encounter: Payer: Self-pay | Admitting: Osteopathic Medicine

## 2017-06-02 ENCOUNTER — Ambulatory Visit: Payer: 59 | Admitting: Osteopathic Medicine

## 2017-06-02 VITALS — BP 115/72 | HR 77 | Temp 98.5°F | Wt 182.1 lb

## 2017-06-02 DIAGNOSIS — F988 Other specified behavioral and emotional disorders with onset usually occurring in childhood and adolescence: Secondary | ICD-10-CM | POA: Diagnosis not present

## 2017-06-02 DIAGNOSIS — G8929 Other chronic pain: Secondary | ICD-10-CM | POA: Diagnosis not present

## 2017-06-02 DIAGNOSIS — M25561 Pain in right knee: Secondary | ICD-10-CM

## 2017-06-02 MED ORDER — LISDEXAMFETAMINE DIMESYLATE 60 MG PO CAPS: 60.0000 mg | ORAL_CAPSULE | ORAL | 0 refills | Status: DC

## 2017-06-02 NOTE — Progress Notes (Signed)
HPI: Hannah Martin is a 32 y.o. female who  has a past medical history of Asthma.  she presents to [facility] today, 06/02/17,  for chief complaint of:  Refill medications Check Knee  Attention deficit disorder: Doing well on current medications but does have some insomnia issues now and then.  Right knee pain: Lateral knee/and or patella, ongoing for several months. No injury that she can recall. No swelling, no fall or feeling instability. Works Engineering geologist, is on her feet most of the day, moving merchandise, walking.    Past medical history, surgical history, social history and family history reviewed. No updates needed.   Current medication list and allergy/intolerance information reviewed.    Current Outpatient Medications on File Prior to Visit  Medication Sig Dispense Refill  . albuterol (PROVENTIL HFA;VENTOLIN HFA) 108 (90 Base) MCG/ACT inhaler Inhale 1-2 puffs into the lungs every 6 (six) hours as needed for wheezing or shortness of breath. 1 Inhaler 6  . budesonide-formoterol (SYMBICORT) 160-4.5 MCG/ACT inhaler Inhale 2 puffs into the lungs 2 (two) times daily. During severe allergy season or acute illness 1 Inhaler 6  . lisdexamfetamine (VYVANSE) 60 MG capsule Take 1 capsule (60 mg total) by mouth every morning. 30 capsule 0  . lisdexamfetamine (VYVANSE) 60 MG capsule Take 1 capsule (60 mg total) by mouth every morning. Fill in 30 days from Rx date 30 capsule 0  . lisdexamfetamine (VYVANSE) 60 MG capsule Take 1 capsule (60 mg total) by mouth every morning. Fill in 60 days from Rx date 30 capsule 0  . montelukast (SINGULAIR) 10 MG tablet Take 1 tablet (10 mg total) by mouth at bedtime. 30 tablet 11   No current facility-administered medications on file prior to visit.    No Known Allergies    Review of Systems:  Constitutional: No recent illness  HEENT: No  headache, no vision change  Cardiac: No  chest pain, No  pressure, No  palpitations  Respiratory:  No  shortness of breath.   Gastrointestinal: No  abdominal pain  Musculoskeletal: +new myalgia/arthralgia  Skin: No  Rash  Neurologic: No  weakness, No  Dizziness  Psychiatric: No  concerns with depression, No  concerns with anxiety  Exam:  BP 115/72   Pulse 77   Temp 98.5 F (36.9 C) (Oral)   Wt 182 lb 1.9 oz (82.6 kg)   LMP 05/14/2017   BMI 29.39 kg/m   Constitutional: VS see above. General Appearance: alert, well-developed, well-nourished, NAD  Eyes: Normal lids and conjunctive, non-icteric sclera  Ears, Nose, Mouth, Throat: MMM, Normal external inspection ears/nares/mouth/lips/gums.  Neck: No masses, trachea midline.   Respiratory: Normal respiratory effort. no wheeze, no rhonchi, no rales  Cardiovascular: S1/S2 normal, no murmur, no rub/gallop auscultated. RRR.   Musculoskeletal: Gait normal. Symmetric and independent movement of all extremities. Normal patellar glide, negative anterior/posterior drawer, negative varus/valgus. Positive lateral McMurrays and some tenderness at tibial insertion patellar tendon..  Neurological: Normal balance/coordination. No tremor.  Skin: warm, dry, intact.   Psychiatric: Normal judgment/insight. Normal mood and affect. Oriented x3.   Dg Knee Complete 4 Views Right  Result Date: 06/03/2017 CLINICAL DATA:  Right knee pain for 4 months common no known injury, initial encounter EXAM: RIGHT KNEE - COMPLETE 4+ VIEW COMPARISON:  None. FINDINGS: No evidence of fracture, dislocation, or joint effusion. No evidence of arthropathy or other focal bone abnormality. Soft tissues are unremarkable. IMPRESSION: No acute abnormality noted. Electronically Signed   By: Alcide Clever  M.D.   On: 06/03/2017 08:25     ASSESSMENT/PLAN:   Attention deficit disorder (ADD) in adult - Plan: lisdexamfetamine (VYVANSE) 60 MG capsule  Chronic pain of right knee - Patellofemoral syndrome seems most likely, possible meniscal injury.  X-rays appear okay. Home exercises given, refer to PT if needed. Follow-up sports if needed - Plan: DG Knee Complete 4 Views Right, CANCELED: DG Knee Complete 4 Views Left   Meds ordered this encounter  Medications  . lisdexamfetamine (VYVANSE) 60 MG capsule    Sig: Take 1 capsule (60 mg total) by mouth every morning.    Dispense:  90 capsule    Refill:  0    If 90 days supply not permitted by pharmacy or insurance, ok to give #30 for 30 days and contact Dr A for additional refills      Follow-up plan: Return in about 3 months (around 09/02/2017) for adhd med refill, sooner if needed for visit w/ sports med for knee .  Visit summary with medication list and pertinent instructions was printed for patient to review, alert us if any changes needed. All questions at time of visit were answered - patient instructed to contact office with any additional concerns. ER/RTC precautions were reviewed with the patient and understanding verbalized.    Please note: voice recognition software was used to produce this document, and typos may escape review. Please contact Dr. Lyn HollingsheadAlexander for any needed clarifications.

## 2017-06-03 ENCOUNTER — Other Ambulatory Visit: Payer: Self-pay | Admitting: Osteopathic Medicine

## 2017-06-03 DIAGNOSIS — F988 Other specified behavioral and emotional disorders with onset usually occurring in childhood and adolescence: Secondary | ICD-10-CM

## 2017-06-04 ENCOUNTER — Encounter: Payer: Self-pay | Admitting: Osteopathic Medicine

## 2017-06-04 DIAGNOSIS — M25561 Pain in right knee: Secondary | ICD-10-CM

## 2017-06-04 DIAGNOSIS — G8929 Other chronic pain: Secondary | ICD-10-CM | POA: Insufficient documentation

## 2017-06-04 NOTE — Telephone Encounter (Signed)
Pt requesting medication refill. Thanks

## 2017-06-07 NOTE — Telephone Encounter (Signed)
Attempted to contact pt for clarification on med refill. No answer, left a brief vm to return call back.

## 2017-06-07 NOTE — Telephone Encounter (Signed)
We just saw her 06/02/17 and 90 days Rx written and printed for pt - is pharmacy requesting a 30 days Rx?

## 2017-06-25 ENCOUNTER — Telehealth: Payer: Self-pay

## 2017-06-25 DIAGNOSIS — F988 Other specified behavioral and emotional disorders with onset usually occurring in childhood and adolescence: Secondary | ICD-10-CM

## 2017-06-25 MED ORDER — LISDEXAMFETAMINE DIMESYLATE 60 MG PO CAPS: 60.0000 mg | ORAL_CAPSULE | ORAL | 0 refills | Status: DC

## 2017-06-25 NOTE — Telephone Encounter (Signed)
#  30 sent to [facility].

## 2017-06-25 NOTE — Telephone Encounter (Signed)
Pt called requesting a refill for vyvanse. As per pt was unable to get #90 from [facility], only #30 dispensed to pt. She is [facility]. Thanks.  Provider's last note - If 90 days supply not permitted by pharmacy or insurance, ok to give #30 for 30 days and contact Dr A for additional refills.

## 2017-06-28 NOTE — Telephone Encounter (Signed)
Pt has been updated.  

## 2017-08-12 ENCOUNTER — Telehealth: Payer: Self-pay

## 2017-08-12 DIAGNOSIS — F988 Other specified behavioral and emotional disorders with onset usually occurring in childhood and adolescence: Secondary | ICD-10-CM

## 2017-08-13 MED ORDER — LISDEXAMFETAMINE DIMESYLATE 60 MG PO CAPS: 60.0000 mg | ORAL_CAPSULE | ORAL | 0 refills | Status: DC

## 2017-08-13 NOTE — Telephone Encounter (Signed)
Sent to [facility] on file, she will be due for her three-month follow-up in June or prior to next refill

## 2017-08-13 NOTE — Telephone Encounter (Signed)
Thanks..the patient is aware of needing to come to her follow up appt.

## 2017-09-02 ENCOUNTER — Encounter: Payer: Self-pay | Admitting: Osteopathic Medicine

## 2017-09-02 ENCOUNTER — Ambulatory Visit (INDEPENDENT_AMBULATORY_CARE_PROVIDER_SITE_OTHER): Payer: 59 | Admitting: Osteopathic Medicine

## 2017-09-02 DIAGNOSIS — F988 Other specified behavioral and emotional disorders with onset usually occurring in childhood and adolescence: Secondary | ICD-10-CM

## 2017-09-02 MED ORDER — LISDEXAMFETAMINE DIMESYLATE 60 MG PO CAPS: 60.0000 mg | ORAL_CAPSULE | ORAL | 0 refills | Status: DC

## 2017-09-02 NOTE — Progress Notes (Signed)
HPI: Hannah Martin is a 32 y.o. female who  has a past medical history of Asthma.  [facility] today, 09/02/17,  for chief complaint of:  Refill medications Check Knee  Attention deficit disorder: Doing well on current medications, no palpitations or significant insomnia issues.   Past medical history, surgical history, social history and family history reviewed. No updates needed.   Current medication list and allergy/intolerance information reviewed.    Current Outpatient Medications on File Prior to Visit  Medication Sig Dispense Refill  . albuterol (PROVENTIL HFA;VENTOLIN HFA) 108 (90 Base) MCG/ACT inhaler Inhale 1-2 puffs into the lungs every 6 (six) hours as needed for wheezing or shortness of breath. 1 Inhaler 6  . budesonide-formoterol (SYMBICORT) 160-4.5 MCG/ACT inhaler Inhale 2 puffs into the lungs 2 (two) times daily. During severe allergy season or acute illness 1 Inhaler 6  . lisdexamfetamine (VYVANSE) 60 MG capsule Take 1 capsule (60 mg total) by mouth every morning. 30 capsule 0  . montelukast (SINGULAIR) 10 MG tablet Take 1 tablet (10 mg total) by mouth at bedtime. 30 tablet 11   No current facility-administered medications on file prior to visit.    No Known Allergies    Review of Systems:  Constitutional: No recent illness  HEENT: No  headache, no vision change  Cardiac: No  chest pain, No  pressure, No palpitations  Respiratory:  No  shortness of breath.   Psychiatric: No  concerns with depression, No  concerns with anxiety  Exam:  BP 115/80   Pulse 79   Ht 5' 5.98" (1.676 m)   Wt 183 lb (83 kg)   SpO2 100%   BMI 29.55 kg/m   Constitutional: VS see above. General Appearance: alert, well-developed, well-nourished, NAD  Eyes: Normal lids and conjunctive, non-icteric sclera  Ears, Nose, Mouth, Throat: MMM, Normal external inspection ears/nares/mouth/lips/gums.  Neck: No masses, trachea  midline.   Respiratory: Normal respiratory effort. no wheeze, no rhonchi, no rales  Cardiovascular: S1/S2 normal, no murmur, no rub/gallop auscultated. RRR.   Musculoskeletal: Gait normal. Symmetric and independent movement of all extremities.   Neurological: Normal balance/coordination. No tremor.  Skin: warm, dry, intact.   Psychiatric: Normal judgment/insight. Normal mood and affect. Oriented x3.     ASSESSMENT/PLAN:   Attention deficit disorder (ADD) in adult - Plan: lisdexamfetamine (VYVANSE) 60 MG capsule, DISCONTINUED: lisdexamfetamine (VYVANSE) 60 MG capsule, DISCONTINUED: lisdexamfetamine (VYVANSE) 60 MG capsule   Meds ordered this encounter  Medications  . DISCONTD: lisdexamfetamine (VYVANSE) 60 MG capsule    Sig: Take 1 capsule (60 mg total) by mouth every morning.    Dispense:  30 capsule    Refill:  0  . DISCONTD: lisdexamfetamine (VYVANSE) 60 MG capsule    Sig: Take 1 capsule (60 mg total) by mouth every morning.    Dispense:  30 capsule    Refill:  0  . lisdexamfetamine (VYVANSE) 60 MG capsule    Sig: Take 1 capsule (60 mg total) by mouth every morning.    Dispense:  30 capsule    Refill:  0      Follow-up plan: Return in about 6 months (around 03/04/2018) for refill ADHD medicines, as long as doing ok. Annual physical when due . OK to refill meds until that time unless there is an issue  Visit summary with medication list and pertinent instructions was printed for patient to review, alert us if any changes needed. All questions at time of visit were  answered - patient instructed to contact office with any additional concerns. ER/RTC precautions were reviewed with the patient and understanding verbalized.    Please note: voice recognition software was used to produce this document, and typos may escape review. Please contact Dr. Lyn Hollingshead for any needed clarifications.

## 2017-09-22 ENCOUNTER — Ambulatory Visit (INDEPENDENT_AMBULATORY_CARE_PROVIDER_SITE_OTHER): Payer: 59 | Admitting: Physician Assistant

## 2017-09-22 ENCOUNTER — Encounter: Payer: Self-pay | Admitting: Physician Assistant

## 2017-09-22 VITALS — BP 134/87 | HR 104 | Temp 98.0°F | Resp 14 | Wt 189.0 lb

## 2017-09-22 DIAGNOSIS — R21 Rash and other nonspecific skin eruption: Secondary | ICD-10-CM | POA: Diagnosis not present

## 2017-09-22 DIAGNOSIS — J4531 Mild persistent asthma with (acute) exacerbation: Secondary | ICD-10-CM | POA: Diagnosis not present

## 2017-09-22 LAB — POCT RAPID STREP A (OFFICE): Rapid Strep A Screen: NEGATIVE

## 2017-09-22 MED ORDER — PREDNISONE 50 MG PO TABS: 50.0000 mg | ORAL_TABLET | Freq: Every day | ORAL | 0 refills | Status: DC

## 2017-09-22 MED ORDER — BUDESONIDE-FORMOTEROL FUMARATE 160-4.5 MCG/ACT IN AERO: 2.0000 | INHALATION_SPRAY | Freq: Two times a day (BID) | RESPIRATORY_TRACT | 0 refills | Status: DC

## 2017-09-22 NOTE — Progress Notes (Signed)
HPI:                                                                Pearson ForsterKandice Lumm is a 32 y.o. female who [facility] today for rash and asthma  This is a pleasant 32 yo F with PMH of asthma who presents with nocturnal cough, wheezing and shortness of breath for the last 3 days. Cough is non-productive and worse at night. Using rescue inhaler more than usual, 3-4 times per day. She is prescribed Singulair and Symbicort but has not taken these medications in months because asthma has been well-controlled. Endorses a mild sore throat and a raised rash on her right shoulder that she noticed last night. Rash is non-pruritic, non-tender. Denies fever, chills, malaise, body ache, rhinorrhea, congestion, sputum production, hemoptysis, or chest pain. Recently traveled to [place] 5 days, stayed at a resort and returned 3 days ago.   [facility] 09/02/2017 01/21/2017  Decreased Interest 1 1  Down, Depressed, Hopeless 1 0  PHQ - 2 Score 2 1  Altered sleeping 1 0  Tired, decreased energy 1 0  Change in appetite 0 0  Feeling bad or failure about yourself  0 0  Trouble concentrating 0 0  Moving slowly or fidgety/restless 0 0  Suicidal thoughts 0 0  PHQ-9 Score 4 1  Difficult doing work/chores Somewhat difficult Not difficult at all    GAD 7 : Generalized Anxiety Score 09/02/2017  Nervous, Anxious, on Edge 0  Control/stop worrying 0  Worry too much - different things 1  Trouble relaxing 0  Restless 0  Easily annoyed or irritable 0  Afraid - awful might happen 0  Total GAD 7 Score 1  Anxiety Difficulty Not difficult at all      Past Medical History:  Diagnosis Date  . Asthma    History reviewed. No pertinent surgical history. Social History   Tobacco Use  . Smoking status: Never Smoker  . Smokeless tobacco: Never Used  Substance Use Topics  . Alcohol use: Yes    Comment: 3 per week    family  history includes Anemia in her mother; Diabetes in her mother; Heart attack in her maternal grandmother.    ROS: negative except as noted in the HPI  Medications: Current Outpatient Medications  Medication Sig Dispense Refill  . albuterol (PROVENTIL HFA;VENTOLIN HFA) 108 (90 Base) MCG/ACT inhaler Inhale 1-2 puffs into the lungs every 6 (six) hours as needed for wheezing or shortness of breath. 1 Inhaler 6  . budesonide-formoterol (SYMBICORT) 160-4.5 MCG/ACT inhaler Inhale 2 puffs into the lungs 2 (two) times daily. During severe allergy season or acute illness 1 Inhaler 0  . [START ON 11/01/2017] lisdexamfetamine (VYVANSE) 60 MG capsule Take 1 capsule (60 mg total) by mouth every morning. 30 capsule 0  . predniSONE (DELTASONE) 50 MG tablet Take 1 tablet (50 mg total) by mouth daily. 5 tablet 0   No current facility-administered medications for this visit.    No Known Allergies     Objective:  BP 134/87   Pulse (!) 104   Temp 98 F (36.7 C) (Oral)   Resp 14   Wt 189 lb (85.7 kg)   SpO2 100%  BMI 30.52 kg/m  Gen:  alert, not ill-appearing, no distress, appropriate for age HEENT: head normocephalic without obvious abnormality, conjunctiva and cornea clear, TM's pearly gray and semi-transparent, oropharynx without erythema or edema, tonsils grade 1, uvula midline, no cervical adenopathy, trachea midline Pulm: Normal work of breathing, normal phonation, clear to auscultation bilaterally, no wheezes, rales or rhonchi CV: mild tachycardia, regular rhythm, s1 and s2 distinct, no murmurs, clicks or rubs  Neuro: alert and oriented x 3, no tremor MSK: extremities atraumatic, normal gait and station Skin: intact, right shoulder/deltoid with sandpapery papular rash Psych: well-groomed, cooperative, good eye contact, euthymic mood, affect mood-congruent, speech is articulate, and thought processes clear and goal-directed    No results found for this or any previous visit (from the past 72  hour(s)). No results found.    Assessment and Plan: 32 y.o. female with   Mild persistent asthma with acute exacerbation - Plan: predniSONE (DELTASONE) 50 MG tablet, budesonide-formoterol (SYMBICORT) 160-4.5 MCG/ACT inhaler  Rash - Plan: POCT rapid strep A - afebrile, no tachypnea, mildly tachycardic at 100-106, which may due to increased bronchodilator use. SpO2 100% on RA at rest, no adventitious lung sounds - re-start Symbicort 2 puffs bid. Prednisone burst - rash is likely viral. POC Strep negative  Patient education and anticipatory guidance given Patient agrees with treatment plan Follow-up as needed if symptoms worsen or fail to improve  Levonne Hubert PA-C

## 2017-09-22 NOTE — Patient Instructions (Signed)
Bronchospasm, Adult Bronchospasm is when airways in the lungs get smaller. When this happens, it can be hard to breathe. You may cough. You may also make a whistling sound when you breathe (wheeze). Follow these instructions at home: Medicines  Take over-the-counter and prescription medicines only as told by your doctor.  If you need to use an inhaler or nebulizer to take your medicine, ask your doctor how to use it.  If you were given a spacer, always use it with your inhaler. Lifestyle  Change your heating and air conditioning filter. Do this at least once a month.  Try not to use fireplaces and wood stoves.  Do not  smoke. Do not  allow smoking in your home.  Try not to use things that have a strong smell, like perfume.  Get rid of pests (such as roaches and mice) and their poop.  Remove any mold from your home.  Keep your house clean. Get rid of dust.  Use cleaning products that have no smell.  Replace carpet with wood, tile, or vinyl flooring.  Use allergy-proof pillows, mattress covers, and box spring covers.  Wash bed sheets and blankets every week. Use hot water. Dry them in a dryer.  Use blankets that are made of polyester or cotton.  Wash your hands often.  Keep pets out of your bedroom.  When you exercise, try not to breathe in cold air. General instructions  Have a plan for getting medical care. Know these things: ? When to call your doctor. ? When to call local emergency services (911 in the [place] ? Where to go in an emergency.  Stay up to date on your shots (immunizations).  When you have an episode: ? Stay calm. ? Relax. ? Breathe slowly. Contact a doctor if:  Your muscles ache.  Your chest hurts.  The color of the mucus you cough up (sputum) changes from clear or white to yellow, green, gray, or bloody.  The mucus you cough up gets thicker.  You have a fever. Get help right away if:  The whistling sound gets worse, even after you  take your medicines.  Your coughing gets worse.  You find it even harder to breathe.  Your chest hurts very much. Summary  Bronchospasm is when airways in the lungs get smaller.  When this happens, it can be hard to breathe. You may cough. You may also make a whistling sound when you breathe.  Stay away from things that cause you to have episodes. These include smoke or dust. This information is not intended to replace advice given to you by your health care provider. Make sure you discuss any questions you have with your health care provider. Document Released: 01/11/2009 Document Revised: 03/19/2016 Document Reviewed: 03/19/2016 Elsevier Interactive Patient Education  2017 Elsevier Inc.  

## 2017-09-30 ENCOUNTER — Encounter: Payer: Self-pay | Admitting: Physician Assistant

## 2017-12-15 ENCOUNTER — Other Ambulatory Visit: Payer: Self-pay

## 2017-12-15 DIAGNOSIS — F988 Other specified behavioral and emotional disorders with onset usually occurring in childhood and adolescence: Secondary | ICD-10-CM

## 2017-12-15 MED ORDER — LISDEXAMFETAMINE DIMESYLATE 60 MG PO CAPS: 60.0000 mg | ORAL_CAPSULE | ORAL | 0 refills | Status: DC

## 2017-12-15 NOTE — Telephone Encounter (Signed)
Per last appt: " Return in about 6 months (around 03/04/2018) for refill ADHD medicines, as long as doing ok. "  Last RX sent 11-01-17.  RX pended, please send if appropriate

## 2017-12-16 ENCOUNTER — Other Ambulatory Visit: Payer: Self-pay | Admitting: Osteopathic Medicine

## 2017-12-16 DIAGNOSIS — F988 Other specified behavioral and emotional disorders with onset usually occurring in childhood and adolescence: Secondary | ICD-10-CM

## 2017-12-16 MED ORDER — LISDEXAMFETAMINE DIMESYLATE 60 MG PO CAPS: 60.0000 mg | ORAL_CAPSULE | ORAL | 0 refills | Status: DC

## 2017-12-17 MED ORDER — LISDEXAMFETAMINE DIMESYLATE 60 MG PO CAPS: 60.0000 mg | ORAL_CAPSULE | ORAL | 0 refills | Status: DC

## 2017-12-17 NOTE — Progress Notes (Signed)
Refill another 3 mos ADHD meds Will be due for 6 mo f/u (can do annual physical) in December

## 2018-01-06 ENCOUNTER — Encounter: Payer: Self-pay | Admitting: Osteopathic Medicine

## 2018-01-06 ENCOUNTER — Ambulatory Visit (INDEPENDENT_AMBULATORY_CARE_PROVIDER_SITE_OTHER): Payer: 59 | Admitting: Osteopathic Medicine

## 2018-01-06 VITALS — BP 114/51 | HR 74 | Temp 98.3°F | Wt 182.4 lb

## 2018-01-06 DIAGNOSIS — F988 Other specified behavioral and emotional disorders with onset usually occurring in childhood and adolescence: Secondary | ICD-10-CM | POA: Diagnosis not present

## 2018-01-06 DIAGNOSIS — Z23 Encounter for immunization: Secondary | ICD-10-CM

## 2018-01-06 DIAGNOSIS — Z0000 Encounter for general adult medical examination without abnormal findings: Secondary | ICD-10-CM

## 2018-01-06 DIAGNOSIS — Z113 Encounter for screening for infections with a predominantly sexual mode of transmission: Secondary | ICD-10-CM

## 2018-01-06 MED ORDER — LISDEXAMFETAMINE DIMESYLATE 60 MG PO CAPS: 60.0000 mg | ORAL_CAPSULE | ORAL | 0 refills | Status: DC

## 2018-01-06 MED ORDER — CLOTRIMAZOLE-BETAMETHASONE 1-0.05 % EX CREA: 1.0000 "application " | TOPICAL_CREAM | Freq: Two times a day (BID) | CUTANEOUS | 1 refills | Status: DC

## 2018-01-06 NOTE — Patient Instructions (Addendum)
General Preventive Care  Most recent routine screening lipids/other labs: ordered today. Cholesterol and Diabetes screening usually recommended annually.   Tobacco: don't! Alcohol: responsible moderation is ok for most adults - if you have concerns about your alcohol intake, please talk to me! Recreational/Illicit Drugs: don't!  Exercise: as tolerated to reduce risk of cardiovascular disease and diabetes. Strength training will also prevent osteoporosis.   Mental health: if need for mental health care (adjust medicines, seek counseling, other), or concerns about moods, please let me know!   Sexual health: if need for STD testing, or if concerns with libido/pain problems, please let me know! If you need to discuss your birth control options, please let me know!  Vaccines  Flu vaccine: recommended for almost everyone, every fall (by Halloween! Flu is scary!).  Shingles vaccine: Shingrix recommended after age 92   Pneumonia vaccines: Prevnar and Pneumovax recommended after age 30  Tetanus booster: Tdap recommended every 10 years, updated today  Cancer screenings   Colon cancer screening: recommended for everyone at age 56  Breast cancer screening: mammogram recommended at age 19  Cervical cancer screening: Pap every 1 to 5 years depending on age and other risk factors. You'll be due 12/2019 Infection screenings . HIV: recommended screening at least once age 57-65, more often if risk factors  . Gonorrhea/Chlamydia: screening as needed, though many insurances require testing for anyone on birth control  . Hepatitis C: recommended for anyone born 54-1963 . TB: certain at-risk populations, or depending on work requirements and/or travel history Other . Bone Density Test: recommended for women at age 67 . Advanced Directive: Living Will and/or Healthcare Power of Attorney recommended for all adults, regardless of age or health!

## 2018-01-06 NOTE — Progress Notes (Signed)
HPI: Hannah Martin is a 32 y.o. female who  has a past medical history of Asthma.  she presents to [facility] today, 01/06/18,  for chief complaint of: Annual Physical   Patient here for annual physical / wellness exam.  See preventive care reviewed as below.  Recent labs reviewed in detail with the patient.   Additional concerns today include: Rash on neck, discoloration and itching about a month.    Past medical, surgical, social and family history reviewed:  Patient Active Problem List   Diagnosis Date Noted  . Mild persistent asthma with acute exacerbation 09/22/2017  . Chronic pain of right knee 06/04/2017  . Attention deficit disorder (ADD) in adult 06/24/2016  . Menorrhagia with regular cycle 11/13/2015  . Easy bruising 11/13/2015  . Routine screening for STI (sexually transmitted infection) 11/13/2015  . History of ADHD 11/12/2015  . History of asthma 11/12/2015    No past surgical history on file.  Social History   Tobacco Use  . Smoking status: Never Smoker  . Smokeless tobacco: Never Used  Substance Use Topics  . Alcohol use: Yes    Comment: 3 per week     Family History  Problem Relation Age of Onset  . Diabetes Mother   . Anemia Mother   . Heart attack Maternal Grandmother      Current medication list and allergy/intolerance information reviewed:    Current Outpatient Medications  Medication Sig Dispense Refill  . albuterol (PROVENTIL HFA;VENTOLIN HFA) 108 (90 Base) MCG/ACT inhaler Inhale 1-2 puffs into the lungs every 6 (six) hours as needed for wheezing or shortness of breath. 1 Inhaler 6  . budesonide-formoterol (SYMBICORT) 160-4.5 MCG/ACT inhaler Inhale 2 puffs into the lungs 2 (two) times daily. During severe allergy season or acute illness 1 Inhaler 0  . [START ON 02/14/2018] lisdexamfetamine (VYVANSE) 60 MG capsule Take 1 capsule (60 mg total) by mouth every morning. 30 capsule 0  . predniSONE  (DELTASONE) 50 MG tablet Take 1 tablet (50 mg total) by mouth daily. 5 tablet 0   No current facility-administered medications for this visit.     No Known Allergies    Review of Systems:  Constitutional:  No  fever, no chills, No recent illness, No unintentional weight changes. No significant fatigue.   HEENT: No  headache, no vision change, no hearing change, No sore throat, No  sinus pressure  Cardiac: No  chest pain, No  pressure, No palpitations, No  Orthopnea  Respiratory:  No  shortness of breath. No  Cough  Gastrointestinal: No  abdominal pain, No  nausea, No  vomiting,  No  blood in stool, No  diarrhea, No  constipation   Musculoskeletal: No new myalgia/arthralgia  Skin: +Rash, No other wounds/concerning lesions  Genitourinary: No  incontinence, No  abnormal genital bleeding, No abnormal genital discharge  Hem/Onc: No  easy bruising/bleeding, No  abnormal lymph node  Endocrine: No cold intolerance,  No heat intolerance. No polyuria/polydipsia/polyphagia   Neurologic: No  weakness, No  dizziness, No  slurred speech/focal weakness/facial droop  Psychiatric: No  concerns with depression, No  concerns with anxiety, No sleep problems, No mood problems  Exam:  BP (!) 114/51 (BP Location: Left Arm, Patient Position: Sitting, Cuff Size: Normal)   Pulse 74   Temp 98.3 F (36.8 C) (Oral)   Wt 182 lb 6.4 oz (82.7 kg)   BMI 29.45 kg/m   Constitutional: VS see above. General Appearance: alert, well-developed, well-nourished, NAD  Eyes: Normal lids and conjunctive, non-icteric sclera  Ears, Nose, Mouth, Throat: MMM, Normal external inspection ears/nares/mouth/lips/gums. TM normal bilaterally. Pharynx/tonsils no erythema, no exudate. Nasal mucosa normal.   Neck: No masses, trachea midline. No thyroid enlargement. No tenderness/mass appreciated. No lymphadenopathy  Respiratory: Normal respiratory effort. no wheeze, no rhonchi, no rales  Cardiovascular: S1/S2 normal, no  murmur, no rub/gallop auscultated. RRR.   Gastrointestinal: Nontender, no masses. No hepatomegaly, no splenomegaly. No hernia appreciated. Bowel sounds normal. Rectal exam deferred.   Musculoskeletal: Gait normal. No clubbing/cyanosis of digits.   Neurological: Normal balance/coordination. No tremor. No cranial nerve deficit on limited exam. Motor and sensation intact and symmetric. Cerebellar reflexes intact.   Skin: warm, dry, intact. Mildly dry/scaling rash and hyperpigmented in lateral streaks/patches on R side of neck c/w most likely tinea versicolor   Psychiatric: Normal judgment/insight. Normal mood and affect. Oriented x3.    No results found for this or any previous visit (from the past 72 hour(s)).  No results found.   ASSESSMENT/PLAN:   Annual physical exam - Plan: CBC, COMPLETE METABOLIC PANEL WITH GFR, Lipid panel  Attention deficit disorder (ADD) in adult - Plan: CBC, COMPLETE METABOLIC PANEL WITH GFR, Lipid panel, lisdexamfetamine (VYVANSE) 60 MG capsule  Need for Tdap vaccination - Plan: Tdap vaccine greater than or equal to 7yo IM  Routine screening for STI (sexually transmitted infection) - Plan: C. trachomatis/N. gonorrhoeae RNA, HIV Antibody (routine testing w rflx), RPR, Trichomonas vaginalis, RNA    Patient Instructions  General Preventive Care  Most recent routine screening lipids/other labs: ordered today. Cholesterol and Diabetes screening usually recommended annually.   Tobacco: don't! Alcohol: responsible moderation is ok for most adults - if you have concerns about your alcohol intake, please talk to me! Recreational/Illicit Drugs: don't!  Exercise: as tolerated to reduce risk of cardiovascular disease and diabetes. Strength training will also prevent osteoporosis.   Mental health: if need for mental health care (adjust medicines, seek counseling, other), or concerns about moods, please let me know!   Sexual health: if need for STD testing, or if  concerns with libido/pain problems, please let me know! If you need to discuss your birth control options, please let me know!  Vaccines  Flu vaccine: recommended for almost everyone, every fall (by Halloween! Flu is scary!).  Shingles vaccine: Shingrix recommended after age 88   Pneumonia vaccines: Prevnar and Pneumovax recommended after age 89  Tetanus booster: Tdap recommended every 10 years, updated today  Cancer screenings   Colon cancer screening: recommended for everyone at age 69  Breast cancer screening: mammogram recommended at age 30  Cervical cancer screening: Pap every 1 to 5 years depending on age and other risk factors. You'll be due 12/2019 Infection screenings . HIV: recommended screening at least once age 66-65, more often if risk factors  . Gonorrhea/Chlamydia: screening as needed, though many insurances require testing for anyone on birth control  . Hepatitis C: recommended for anyone born 63-1963 . TB: certain at-risk populations, or depending on work requirements and/or travel history Other . Bone Density Test: recommended for women at age 61 . Advanced Directive: Living Will and/or Healthcare Power of Attorney recommended for all adults, regardless of age or health!    Visit summary with medication list and pertinent instructions was printed for patient to review. All questions at time of visit were answered - patient instructed to contact office with any additional concerns. ER/RTC precautions were reviewed with the patient.   Follow-up plan: Return in  about 3 months (around 04/08/2018) for refill ADHD medications, see me sooner if needed .   Please note: voice recognition software was used to produce this document, and typos may escape review. Please contact Dr. Lyn Hollingshead for any needed clarifications.

## 2018-01-07 LAB — COMPLETE METABOLIC PANEL WITHOUT GFR
AG Ratio: 1.9 (calc) (ref 1.0–2.5)
ALT: 10 U/L (ref 6–29)
AST: 11 U/L (ref 10–30)
Albumin: 4.4 g/dL (ref 3.6–5.1)
Alkaline phosphatase (APISO): 53 U/L (ref 33–115)
BUN: 17 mg/dL (ref 7–25)
CO2: 22 mmol/L (ref 20–32)
Calcium: 9.6 mg/dL (ref 8.6–10.2)
Chloride: 110 mmol/L (ref 98–110)
Creat: 0.83 mg/dL (ref 0.50–1.10)
GFR, Est African American: 110 mL/min/1.73m2 (ref >=60)
GFR, Est Non African American: 95 mL/min/1.73m2 (ref >=60)
Globulin: 2.3 g/dL (ref 1.9–3.7)
Glucose, Bld: 75 mg/dL (ref 65–139)
Potassium: 4.4 mmol/L (ref 3.5–5.3)
Sodium: 139 mmol/L (ref 135–146)
Total Bilirubin: 0.5 mg/dL (ref 0.2–1.2)
Total Protein: 6.7 g/dL (ref 6.1–8.1)

## 2018-01-07 LAB — CBC
HCT: 38 % (ref 35.0–45.0)
Hemoglobin: 12.7 g/dL (ref 11.7–15.5)
MCH: 29.4 pg (ref 27.0–33.0)
MCHC: 33.4 g/dL (ref 32.0–36.0)
MCV: 88 fL (ref 80.0–100.0)
MPV: 10.5 fL (ref 7.5–12.5)
Platelets: 328 Thousand/uL (ref 140–400)
RBC: 4.32 Million/uL (ref 3.80–5.10)
RDW: 11.9 % (ref 11.0–15.0)
WBC: 4 Thousand/uL (ref 3.8–10.8)

## 2018-01-07 LAB — LIPID PANEL
CHOL/HDL RATIO: 3.3 (calc) (ref <5.0)
CHOLESTEROL: 198 mg/dL (ref <200)
HDL: 60 mg/dL (ref >50)
LDL CHOLESTEROL (CALC): 122 mg/dL — AB
NON-HDL CHOLESTEROL (CALC): 138 mg/dL — AB (ref <130)
Triglycerides: 70 mg/dL (ref <150)

## 2018-01-13 LAB — TEST AUTHORIZATION

## 2018-01-13 LAB — HIV ANTIBODY (ROUTINE TESTING W REFLEX): HIV: NONREACTIVE

## 2018-01-13 LAB — RPR: RPR: NONREACTIVE

## 2018-01-13 LAB — C. TRACHOMATIS/N. GONORRHOEAE RNA
C. trachomatis RNA, TMA: NOT DETECTED
N. GONORRHOEAE RNA, TMA: NOT DETECTED

## 2018-01-13 LAB — TRICHOMONAS VAGINALIS, PROBE AMP: TRICHOMONAS VAGINALIS RNA: NOT DETECTED

## 2018-01-28 ENCOUNTER — Other Ambulatory Visit: Payer: Self-pay

## 2018-01-28 DIAGNOSIS — F988 Other specified behavioral and emotional disorders with onset usually occurring in childhood and adolescence: Secondary | ICD-10-CM

## 2018-01-28 MED ORDER — LISDEXAMFETAMINE DIMESYLATE 60 MG PO CAPS: 60.0000 mg | ORAL_CAPSULE | ORAL | 0 refills | Status: DC

## 2018-01-28 NOTE — Telephone Encounter (Signed)
Jaxyn states her insurance will no longer pay for prescription at [facility]. She needs the Vyvanse sent to [facility].    I have called and cancelled the October and November prescriptions at [facility]. She never picked up the October prescription.

## 2018-01-28 NOTE — Telephone Encounter (Signed)
OK refills sent!

## 2018-03-10 ENCOUNTER — Encounter: Payer: Self-pay | Admitting: Osteopathic Medicine

## 2018-03-10 ENCOUNTER — Ambulatory Visit (INDEPENDENT_AMBULATORY_CARE_PROVIDER_SITE_OTHER): Payer: 59 | Admitting: Osteopathic Medicine

## 2018-03-10 DIAGNOSIS — F988 Other specified behavioral and emotional disorders with onset usually occurring in childhood and adolescence: Secondary | ICD-10-CM | POA: Diagnosis not present

## 2018-03-10 MED ORDER — LISDEXAMFETAMINE DIMESYLATE 60 MG PO CAPS: 60.0000 mg | ORAL_CAPSULE | ORAL | 0 refills | Status: DC

## 2018-03-10 NOTE — Progress Notes (Signed)
HPI: Hannah Martin is a 32 y.o. female who  has a past medical history of Asthma.  she presents to [facility] today, 03/10/18,  for chief complaint of:  ADD refills   Doing well on current meds No insomnia, palpitations, anxiety.     At today's visit... Past medical history, surgical history, and family history reviewed and updated as needed.  Current medication list and allergy/intolerance information reviewed and updated as needed. (See remainder of HPI, ROS, Phys Exam below)        ASSESSMENT/PLAN: The encounter diagnosis was Attention deficit disorder (ADD) in adult.    Meds ordered this encounter  Medications  . DISCONTD: lisdexamfetamine (VYVANSE) 60 MG capsule    Sig: Take 1 capsule (60 mg total) by mouth every morning.    Dispense:  30 capsule    Refill:  0  . DISCONTD: lisdexamfetamine (VYVANSE) 60 MG capsule    Sig: Take 1 capsule (60 mg total) by mouth every morning.    Dispense:  30 capsule    Refill:  0  . lisdexamfetamine (VYVANSE) 60 MG capsule    Sig: Take 1 capsule (60 mg total) by mouth every morning.    Dispense:  30 capsule    Refill:  0    Patient Instructions  Refilled meds - will run out around 06/08/18 Call or message me around that time and can refill another 3 mos See me in 6 mos! Sooner if needed      Follow-up plan: Return in about 6 months (around 09/09/2018) for refill ADD meds, sooner if needed.                             ############################################ ############################################ ############################################ ############################################    Current Meds  Medication Sig  . albuterol (PROVENTIL HFA;VENTOLIN HFA) 108 (90 Base) MCG/ACT inhaler Inhale 1-2 puffs into the lungs every 6 (six) hours as needed for wheezing or shortness of breath.  . budesonide-formoterol (SYMBICORT) 160-4.5 MCG/ACT inhaler  Inhale 2 puffs into the lungs 2 (two) times daily. During severe allergy season or acute illness  . [START ON 05/09/2018] lisdexamfetamine (VYVANSE) 60 MG capsule Take 1 capsule (60 mg total) by mouth every morning.  . [DISCONTINUED] clotrimazole-betamethasone (LOTRISONE) cream Apply 1 application topically 2 (two) times daily. Continue a week after rash resolves  . [DISCONTINUED] lisdexamfetamine (VYVANSE) 60 MG capsule Take 1 capsule (60 mg total) by mouth every morning.  . [DISCONTINUED] lisdexamfetamine (VYVANSE) 60 MG capsule Take 1 capsule (60 mg total) by mouth every morning.  . [DISCONTINUED] lisdexamfetamine (VYVANSE) 60 MG capsule Take 1 capsule (60 mg total) by mouth every morning.  . [DISCONTINUED] predniSONE (DELTASONE) 50 MG tablet Take 1 tablet (50 mg total) by mouth daily.    No Known Allergies     Review of Systems:  Constitutional: No recent illness, feeling well today  Cardiac: No  chest pain, No  pressure, No palpitations  Respiratory:  No  shortness of breath.  Psychiatric: No  concerns with depression, No  concerns with anxiety  Exam:  BP 124/60 (BP Location: Left Arm, Patient Position: Sitting, Cuff Size: Normal)   Pulse 67   Temp 98 F (36.7 C) (Oral)   Wt 181 lb 4.8 oz (82.2 kg)   BMI 29.28 kg/m   Constitutional: VS see above. General Appearance: alert, well-developed, well-nourished, NAD  Eyes: Normal lids and conjunctive, non-icteric sclera  Ears, Nose, Mouth, Throat: MMM, Normal  external inspection ears/nares/mouth/lips/gums.  Neck: No masses, trachea midline.   Respiratory: Normal respiratory effort. no wheeze, no rhonchi, no rales  Cardiovascular: S1/S2 normal, no murmur, no rub/gallop auscultated. RRR.   Musculoskeletal: Gait normal. Symmetric and independent movement of all extremities  Neurological: Normal balance/coordination. No tremor.  Skin: warm, dry, intact.   Psychiatric: Normal judgment/insight. Normal mood and affect. Oriented  x3.       Visit summary with medication list and pertinent instructions was printed for patient to review, patient was advised to alert us if any updates are needed. All questions at time of visit were answered - patient instructed to contact office with any additional concerns. ER/RTC precautions were reviewed with the patient and understanding verbalized.     Please note: voice recognition software was used to produce this document, and typos may escape review. Please contact Dr. Lyn HollingsheadAlexander for any needed clarifications.    Follow up plan: Return in about 6 months (around 09/09/2018) for refill ADD meds, sooner if needed.

## 2018-03-10 NOTE — Patient Instructions (Signed)
Refilled meds - will run out around 06/08/18 Call or message me around that time and can refill another 3 mos See me in 6 mos! Sooner if needed

## 2018-04-05 ENCOUNTER — Ambulatory Visit: Payer: 59 | Admitting: Osteopathic Medicine

## 2018-07-05 ENCOUNTER — Other Ambulatory Visit: Payer: Self-pay

## 2018-07-05 DIAGNOSIS — F988 Other specified behavioral and emotional disorders with onset usually occurring in childhood and adolescence: Secondary | ICD-10-CM

## 2018-07-05 MED ORDER — LISDEXAMFETAMINE DIMESYLATE 60 MG PO CAPS: 60.0000 mg | ORAL_CAPSULE | ORAL | 0 refills | Status: DC

## 2018-07-05 NOTE — Telephone Encounter (Signed)
Patient requests a refill on Vyvanse.

## 2018-08-11 ENCOUNTER — Other Ambulatory Visit: Payer: Self-pay

## 2018-08-11 ENCOUNTER — Encounter: Payer: Self-pay | Admitting: Osteopathic Medicine

## 2018-08-11 ENCOUNTER — Ambulatory Visit: Payer: 59 | Admitting: Osteopathic Medicine

## 2018-08-11 VITALS — BP 125/82 | HR 99 | Temp 99.0°F | Wt 177.8 lb

## 2018-08-11 DIAGNOSIS — Z3201 Encounter for pregnancy test, result positive: Secondary | ICD-10-CM | POA: Diagnosis not present

## 2018-08-11 DIAGNOSIS — Z8709 Personal history of other diseases of the respiratory system: Secondary | ICD-10-CM

## 2018-08-11 DIAGNOSIS — J4531 Mild persistent asthma with (acute) exacerbation: Secondary | ICD-10-CM

## 2018-08-11 DIAGNOSIS — N926 Irregular menstruation, unspecified: Secondary | ICD-10-CM

## 2018-08-11 DIAGNOSIS — Z3A01 Less than 8 weeks gestation of pregnancy: Secondary | ICD-10-CM | POA: Diagnosis not present

## 2018-08-11 LAB — POCT URINE PREGNANCY: Preg Test, Ur: POSITIVE — AB

## 2018-08-11 MED ORDER — BUDESONIDE-FORMOTEROL FUMARATE 160-4.5 MCG/ACT IN AERO: 2.0000 | INHALATION_SPRAY | Freq: Two times a day (BID) | RESPIRATORY_TRACT | 0 refills | Status: DC

## 2018-08-11 MED ORDER — ALBUTEROL SULFATE HFA 108 (90 BASE) MCG/ACT IN AERS: 1.0000 | INHALATION_SPRAY | Freq: Four times a day (QID) | RESPIRATORY_TRACT | 6 refills | Status: DC | PRN

## 2018-08-11 MED ORDER — ONDANSETRON 8 MG PO TBDP: 8.0000 mg | ORAL_TABLET | Freq: Three times a day (TID) | ORAL | 3 refills | Status: DC | PRN

## 2018-08-11 NOTE — Patient Instructions (Addendum)
Based on last period on 06/17/18:  Gestational Age [redacted] weeks & 6 days  Due Date Friday, Mar 24, 2019  Probable date of Conception Friday, Jul 01, 2016   See printed info for other pregnancy-related topics!   STOP Vyvnase, sorry! Use of amphetamines during pregnancy may lead to an increased risk of premature birth and low birth weight; newborns may experience symptoms of withdrawal. Behavioral problems may also occur later in childhood.   Call/message me if any other questions or concerns!

## 2018-08-11 NOTE — Progress Notes (Signed)
HPI: Hannah Martin is a 33 y.o. female who  has a past medical history of Asthma.  she presents to [facility] today, 08/11/18,  for chief complaint of:  Pregnancy confirmation Illness: nausea   LMP: week of 06/17/18 Home pregnancy test +4 days ago. NO vaginal bleeding, lower abdominal/pelvic cramping, vaginal discharge. Reports some nausea over the past week or so, better over the last couple of days.    At today's visit 08/11/18 ... PMH, PSH, FH reviewed and updated as needed.  Current medication list and allergy/intolerance hx reviewed and updated as needed. (See remainder of HPI, ROS, Phys Exam below)   No results found.  Results for orders placed or performed in visit on 08/11/18 (from the past 72 hour(s))  POCT urine pregnancy     Status: Abnormal   Collection Time: 08/11/18  9:18 AM  Result Value Ref Range   Preg Test, Ur Positive (A) Negative          ASSESSMENT/PLAN: The primary encounter diagnosis was Less than [redacted] weeks gestation of pregnancy. Diagnoses of Missed period, History of asthma, and Mild persistent asthma with acute exacerbation were also pertinent to this visit.   Orders Placed This Encounter  Procedures  . Ambulatory referral to Obstetrics / Gynecology  . POCT urine pregnancy     Meds ordered this encounter  Medications  . albuterol (VENTOLIN HFA) 108 (90 Base) MCG/ACT inhaler    Sig: Inhale 1-2 puffs into the lungs every 6 (six) hours as needed for wheezing or shortness of breath.    Dispense:  1 Inhaler    Refill:  6  . budesonide-formoterol (SYMBICORT) 160-4.5 MCG/ACT inhaler    Sig: Inhale 2 puffs into the lungs 2 (two) times daily. During severe allergy season or acute illness    Dispense:  1 Inhaler    Refill:  0  . ondansetron (ZOFRAN-ODT) 8 MG disintegrating tablet    Sig: Take 1 tablet (8 mg total) by mouth every 8 (eight) hours as needed for nausea.    Dispense:  20 tablet    Refill:  3     Patient Instructions  Based on last period on 06/17/18:  Gestational Age [redacted] weeks & 6 days  Due Date Friday, Mar 24, 2019  Probable date of Conception Friday, Jul 01, 2018   See printed info for other pregnancy-related topics!   STOP Vyvnase, sorry! Use of amphetamines during pregnancy may lead to an increased risk of premature birth and low birth weight; newborns may experience symptoms of withdrawal. Behavioral problems may also occur later in childhood.   Call/message me if any other questions or concerns!           Follow-up plan: Return for recheck as needed! .                                                 ################################################# ################################################# ################################################# #################################################    Current Meds  Medication Sig  . albuterol (VENTOLIN HFA) 108 (90 Base) MCG/ACT inhaler Inhale 1-2 puffs into the lungs every 6 (six) hours as needed for wheezing or shortness of breath.  . budesonide-formoterol (SYMBICORT) 160-4.5 MCG/ACT inhaler Inhale 2 puffs into the lungs 2 (two) times daily. During severe allergy season or acute illness  . [DISCONTINUED] albuterol (PROVENTIL HFA;VENTOLIN HFA) 108 (90 Base) MCG/ACT inhaler Inhale 1-2  puffs into the lungs every 6 (six) hours as needed for wheezing or shortness of breath.  . [DISCONTINUED] budesonide-formoterol (SYMBICORT) 160-4.5 MCG/ACT inhaler Inhale 2 puffs into the lungs 2 (two) times daily. During severe allergy season or acute illness    No Known Allergies     Review of Systems:  Constitutional: No fever/chills  HEENT: No  headache, no vision change  Cardiac: No  chest pain, No  pressure, No palpitations  Respiratory:  No  shortness of breath. No  Cough  Gastrointestinal: No  abdominal pain  Musculoskeletal: No new  myalgia/arthralgia  Hem/Onc: No  easy bruising/bleeding  Neurologic: No  weakness, No  Dizziness  Psychiatric: No  concerns with depression, No  concerns with anxiety  Exam:  BP 125/82 (BP Location: Left Arm, Patient Position: Sitting, Cuff Size: Normal)   Pulse 99   Temp 99 F (37.2 C) (Oral)   Wt 177 lb 12.8 oz (80.6 kg)   BMI 28.71 kg/m   Constitutional: VS see above. General Appearance: alert, well-developed, well-nourished, NAD  Eyes: Normal lids and conjunctive, non-icteric sclera  Ears, Nose, Mouth, Throat: MMM, Normal external inspection ears/nares/mouth/lips/gums.  Neck: No masses, trachea midline.   Respiratory: Normal respiratory effort.   Musculoskeletal: Gait normal. Symmetric and independent movement of all extremities  Neurological: Normal balance/coordination. No tremor.  Skin: warm, dry, intact.   Psychiatric: Normal judgment/insight. Normal mood and affect. Oriented x3.       Visit summary with medication list and pertinent instructions was printed for patient to review, patient was advised to alert [place] if any updates are needed. All questions at time of visit were answered - patient instructed to contact office with any additional concerns. ER/RTC precautions were reviewed with the patient and understanding verbalized.     Please note: voice recognition software was used to produce this document, and typos may escape review. Please contact Dr. Lyn Hollingshead for any needed clarifications.    Follow up plan: Return for recheck as needed! Marland Kitchen

## 2018-08-26 ENCOUNTER — Telehealth: Payer: Self-pay | Admitting: *Deleted

## 2018-08-26 NOTE — Telephone Encounter (Signed)
Left patient a message to call or answer MyChart screening questions prior to appointment on 08/29/2018. Office policies also stated in message.

## 2018-08-29 ENCOUNTER — Encounter: Payer: 59 | Admitting: Obstetrics & Gynecology

## 2018-08-29 DIAGNOSIS — Z3491 Encounter for supervision of normal pregnancy, unspecified, first trimester: Secondary | ICD-10-CM

## 2018-09-07 ENCOUNTER — Telehealth: Payer: Self-pay

## 2018-09-07 DIAGNOSIS — F988 Other specified behavioral and emotional disorders with onset usually occurring in childhood and adolescence: Secondary | ICD-10-CM

## 2018-09-07 NOTE — Telephone Encounter (Signed)
Patient left msg for triage, states that she is no longer pregnant. Wants to know if she can restart Vyvanse.

## 2018-09-08 MED ORDER — LISDEXAMFETAMINE DIMESYLATE 60 MG PO CAPS: 60.0000 mg | ORAL_CAPSULE | ORAL | 0 refills | Status: DC

## 2018-09-08 NOTE — Telephone Encounter (Signed)
Pt advised.

## 2018-09-08 NOTE — Telephone Encounter (Signed)
Refill sent to pharmacy on file! Okay to refill without office visit for 6 months, patient should let me know when she is due for refill or can have pharmacy contact us.

## 2018-10-28 ENCOUNTER — Other Ambulatory Visit: Payer: Self-pay | Admitting: *Deleted

## 2018-10-28 DIAGNOSIS — F988 Other specified behavioral and emotional disorders with onset usually occurring in childhood and adolescence: Secondary | ICD-10-CM

## 2018-10-31 ENCOUNTER — Other Ambulatory Visit: Payer: Self-pay

## 2018-10-31 DIAGNOSIS — F988 Other specified behavioral and emotional disorders with onset usually occurring in childhood and adolescence: Secondary | ICD-10-CM

## 2018-10-31 MED ORDER — LISDEXAMFETAMINE DIMESYLATE 60 MG PO CAPS: 60.0000 mg | ORAL_CAPSULE | ORAL | 0 refills | Status: DC

## 2018-10-31 NOTE — Telephone Encounter (Signed)
Vyvanse refill. She has been scheduled for follow up.

## 2018-11-01 ENCOUNTER — Encounter: Payer: Self-pay | Admitting: Osteopathic Medicine

## 2018-11-01 ENCOUNTER — Ambulatory Visit (INDEPENDENT_AMBULATORY_CARE_PROVIDER_SITE_OTHER): Payer: Self-pay | Admitting: Osteopathic Medicine

## 2018-11-01 DIAGNOSIS — F988 Other specified behavioral and emotional disorders with onset usually occurring in childhood and adolescence: Secondary | ICD-10-CM

## 2018-11-01 MED ORDER — LISDEXAMFETAMINE DIMESYLATE 60 MG PO CAPS: 60.0000 mg | ORAL_CAPSULE | ORAL | 0 refills | Status: DC

## 2018-11-01 NOTE — Progress Notes (Signed)
Virtual Visit via Video (App used: Doximity) Note  I connected with      Hannah Martin [place] on 11/01/18 at 7:17 AM by a telemedicine application and verified that I am speaking with the correct person using two identifiers.  Patient is at home I am in office   I discussed the limitations of evaluation and management by telemedicine and the availability of in person appointments. The patient expressed understanding and agreed to proceed.  History of Present Illness: Hannah Martin is a 33 y.o. female who would like to discuss ADHD refills    I said in previous note ok for 6 mos Pt was told she needed a visit    Observations/Objective: There were no vitals taken for this visit. BP Readings from Last 3 Encounters:  08/11/18 125/82  03/10/18 124/60  01/06/18 (!) 114/51   Exam: Normal Speech.  NAD  Lab and Radiology Results No results found for this or any previous visit (from the past 72 hour(s)). No results found.     Assessment and Plan: 33 y.o. female with The encounter diagnosis was Attention deficit disorder (ADD) in adult.  Sent refills until 001/03/2019 OK to hold off on annual for now given pandemic, will repeat labs and annual in 6 mos OK TO REFILL MEDS UNTIL FOLLOW-UP 05/2019  PDMP reviewed during this encounter. No orders of the defined types were placed in this encounter.  Meds ordered this encounter  Medications  . DISCONTD: lisdexamfetamine (VYVANSE) 60 MG capsule    Sig: Take 1 capsule (60 mg total) by mouth every morning.    Dispense:  30 capsule    Refill:  0  . DISCONTD: lisdexamfetamine (VYVANSE) 60 MG capsule    Sig: Take 1 capsule (60 mg total) by mouth every morning.    Dispense:  30 capsule    Refill:  0  . DISCONTD: lisdexamfetamine (VYVANSE) 60 MG capsule    Sig: Take 1 capsule (60 mg total) by mouth every morning.    Dispense:  30 capsule    Refill:  0  . lisdexamfetamine (VYVANSE) 60 MG capsule    Sig: Take 1 capsule (60 mg  total) by mouth every morning.    Dispense:  30 capsule    Refill:  0   There are no Patient Instructions on file for this visit.  Instructions sent via MyChart. If MyChart not available, pt was given option for info via personal e-mail w/ no guarantee of protected health info over unsecured e-mail communication, and MyChart sign-up instructions were included.   Follow Up Instructions: Return in about 6 months (around 05/04/2019) [place] (call week prior to visit for lab orders).    I discussed the assessment and treatment plan with the patient. The patient was provided an opportunity to ask questions and all were answered. The patient agreed with the plan and demonstrated an understanding of the instructions.   The patient was advised to call back or seek an in-person evaluation if any new concerns, if symptoms worsen or if the condition fails to improve as anticipated.                       Historical information moved to improve visibility of documentation.  Past Medical History:  Diagnosis Date  . Asthma    No past surgical history on file. Social History   Tobacco Use  . Smoking status: Never Smoker  . Smokeless tobacco: Never Used  Substance Use Topics  .  Alcohol use: Yes    Comment: 3 per week    family history includes Anemia in her mother; Diabetes in her mother; Heart attack in her maternal grandmother.  Medications: Current Outpatient Medications  Medication Sig Dispense Refill  . albuterol (VENTOLIN HFA) 108 (90 Base) MCG/ACT inhaler Inhale 1-2 puffs into the lungs every 6 (six) hours as needed for wheezing or shortness of breath. 1 Inhaler 6  . [START ON 02/28/2019] lisdexamfetamine (VYVANSE) 60 MG capsule Take 1 capsule (60 mg total) by mouth every morning. 30 capsule 0  . ondansetron (ZOFRAN-ODT) 8 MG disintegrating tablet Take 1 tablet (8 mg total) by mouth every 8 (eight) hours as needed for nausea. 20 tablet 3   No current  facility-administered medications for this visit.    No Known Allergies  PDMP reviewed during this encounter. No orders of the defined types were placed in this encounter.  Meds ordered this encounter  Medications  . DISCONTD: lisdexamfetamine (VYVANSE) 60 MG capsule    Sig: Take 1 capsule (60 mg total) by mouth every morning.    Dispense:  30 capsule    Refill:  0  . DISCONTD: lisdexamfetamine (VYVANSE) 60 MG capsule    Sig: Take 1 capsule (60 mg total) by mouth every morning.    Dispense:  30 capsule    Refill:  0  . DISCONTD: lisdexamfetamine (VYVANSE) 60 MG capsule    Sig: Take 1 capsule (60 mg total) by mouth every morning.    Dispense:  30 capsule    Refill:  0  . lisdexamfetamine (VYVANSE) 60 MG capsule    Sig: Take 1 capsule (60 mg total) by mouth every morning.    Dispense:  30 capsule    Refill:  0

## 2019-04-03 ENCOUNTER — Other Ambulatory Visit: Payer: Self-pay

## 2019-04-03 DIAGNOSIS — F988 Other specified behavioral and emotional disorders with onset usually occurring in childhood and adolescence: Secondary | ICD-10-CM

## 2019-04-03 MED ORDER — LISDEXAMFETAMINE DIMESYLATE 60 MG PO CAPS: 60.0000 mg | ORAL_CAPSULE | ORAL | 0 refills | Status: DC

## 2019-04-03 NOTE — Telephone Encounter (Signed)
Hannah Martin requests a refill on Vyvanse. She was also advised to schedule a CPE in February.

## 2019-04-14 ENCOUNTER — Ambulatory Visit (INDEPENDENT_AMBULATORY_CARE_PROVIDER_SITE_OTHER): Payer: 59 | Admitting: Osteopathic Medicine

## 2019-04-14 ENCOUNTER — Encounter: Payer: Self-pay | Admitting: Osteopathic Medicine

## 2019-04-14 DIAGNOSIS — F988 Other specified behavioral and emotional disorders with onset usually occurring in childhood and adolescence: Secondary | ICD-10-CM | POA: Diagnosis not present

## 2019-04-14 NOTE — Progress Notes (Signed)
Virtual Visit via Phone I connected with      Hannah Martin on 04/14/19 at 9:24 AM  by a telemedicine application and verified that I am speaking with the correct person using two identifiers.  Patient is at home I am in office   I discussed the limitations of evaluation and management by telemedicine and the availability of in person appointments. The patient expressed understanding and agreed to proceed.  History of Present Illness: Hannah Martin is a 34 y.o. female who would like to discuss Refill Vyvanse    Doing well otherwise     Observations/Objective: There were no vitals taken for this visit. BP Readings from Last 3 Encounters:  08/11/18 125/82  03/10/18 124/60  01/06/18 (!) 114/51   Exam: Normal Speech.  NAD  Lab and Radiology Results No results found for this or any previous visit (from the past 72 hour(s)). No results found.     Assessment and Plan: 34 y.o. female with The encounter diagnosis was Attention deficit disorder (ADD) in adult.   PDMP reviewed during this encounter. No orders of the defined types were placed in this encounter.  Meds ordered this encounter  Medications  . lisdexamfetamine (VYVANSE) 60 MG capsule    Sig: Take 1 capsule (60 mg total) by mouth every morning.    Dispense:  30 capsule    Refill:  0  . lisdexamfetamine (VYVANSE) 60 MG capsule    Sig: Take 1 capsule (60 mg total) by mouth every morning.    Dispense:  30 capsule    Refill:  0  . lisdexamfetamine (VYVANSE) 60 MG capsule    Sig: Take 1 capsule (60 mg total) by mouth every morning.    Dispense:  30 capsule    Refill:  0  . lisdexamfetamine (VYVANSE) 60 MG capsule    Sig: Take 1 capsule (60 mg total) by mouth every morning.    Dispense:  30 capsule    Refill:  0  . lisdexamfetamine (VYVANSE) 60 MG capsule    Sig: Take 1 capsule (60 mg total) by mouth every morning.    Dispense:  30 capsule    Refill:  0  . lisdexamfetamine (VYVANSE) 60 MG capsule     Sig: Take 1 capsule (60 mg total) by mouth every morning.    Dispense:  30 capsule    Refill:  0      Follow Up Instructions: Return in about 6 months (around 10/12/2019) for ANNUAL & PAP (call week prior to visit for lab orders).    I discussed the assessment and treatment plan with the patient. The patient was provided an opportunity to ask questions and all were answered. The patient agreed with the plan and demonstrated an understanding of the instructions.   The patient was advised to call back or seek an in-person evaluation if any new concerns, if symptoms worsen or if the condition fails to improve as anticipated.  15 minutes of non-face-to-face time was provided during this encounter.      . . . . . . . . . . . . . Marland Kitchen                   Historical information moved to improve visibility of documentation.  Past Medical History:  Diagnosis Date  . Asthma    No past surgical history on file. Social History   Tobacco Use  . Smoking status: Never Smoker  . Smokeless tobacco: Never Used  Substance  Use Topics  . Alcohol use: Yes    Comment: 3 per week    family history includes Anemia in her mother; Diabetes in her mother; Heart attack in her maternal grandmother.  Medications: Current Outpatient Medications  Medication Sig Dispense Refill  . albuterol (VENTOLIN HFA) 108 (90 Base) MCG/ACT inhaler Inhale 1-2 puffs into the lungs every 6 (six) hours as needed for wheezing or shortness of breath. 1 Inhaler 6  . lisdexamfetamine (VYVANSE) 60 MG capsule Take 1 capsule (60 mg total) by mouth every morning. 30 capsule 0  . ondansetron (ZOFRAN-ODT) 8 MG disintegrating tablet Take 1 tablet (8 mg total) by mouth every 8 (eight) hours as needed for nausea. 20 tablet 3   No current facility-administered medications for this visit.   No Known Allergies

## 2019-04-15 MED ORDER — LISDEXAMFETAMINE DIMESYLATE 60 MG PO CAPS: 60.0000 mg | ORAL_CAPSULE | ORAL | 0 refills | Status: DC

## 2019-07-21 IMAGING — DX DG KNEE COMPLETE 4+V*R*
4 series · 4 of 4 positions shown · non-contrast
Comparison: None.

CLINICAL DATA: Right knee pain for 4 months common no known injury,
initial encounter

EXAM:
RIGHT KNEE - COMPLETE 4+ VIEW

[knee ap]
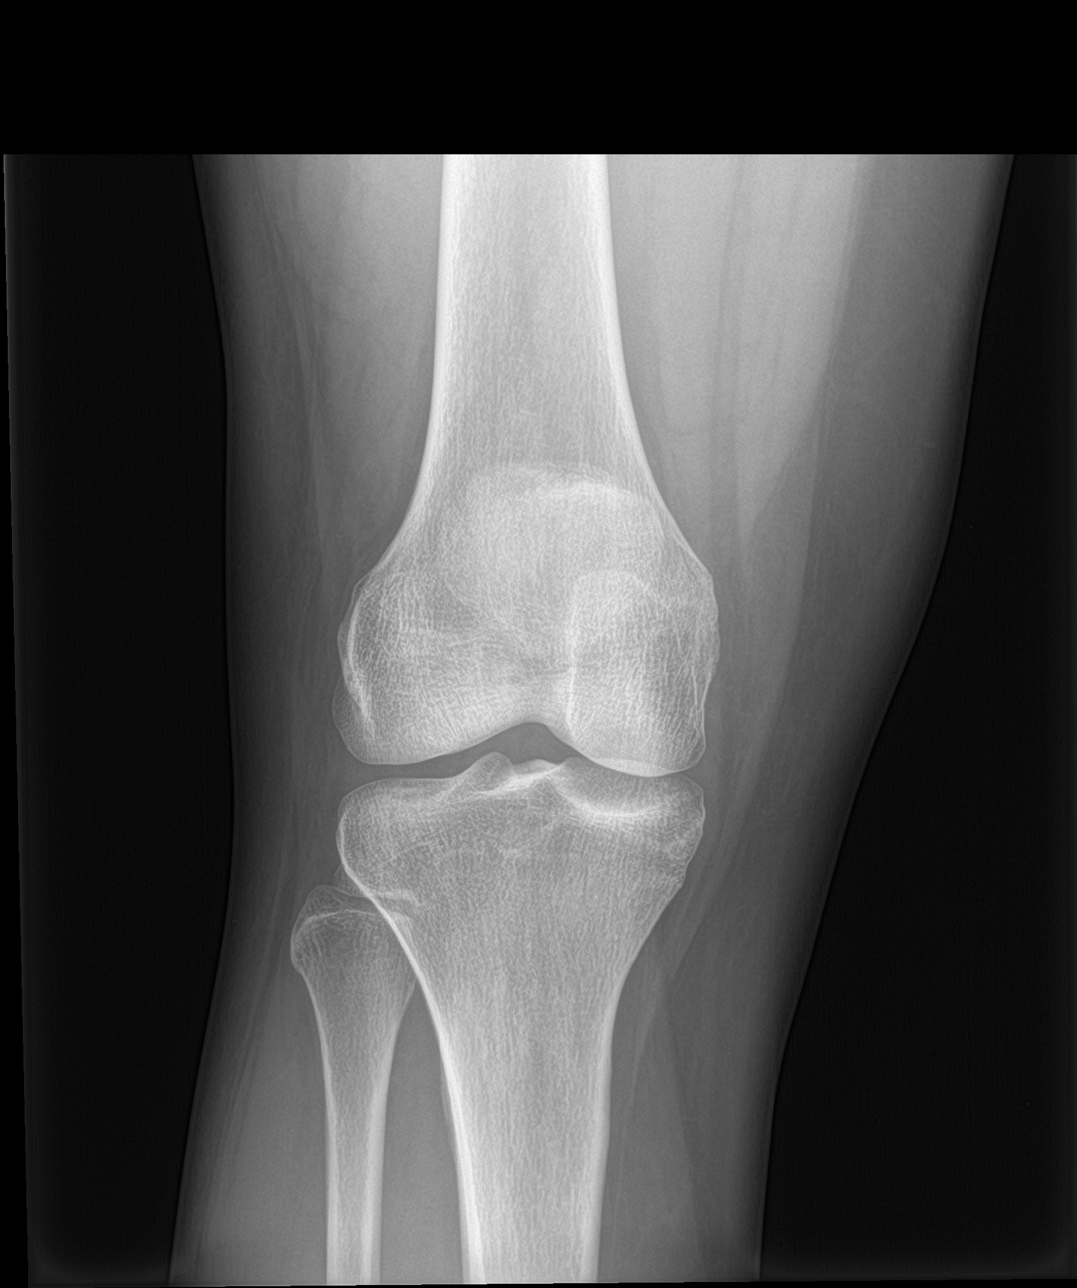

[knee lat]
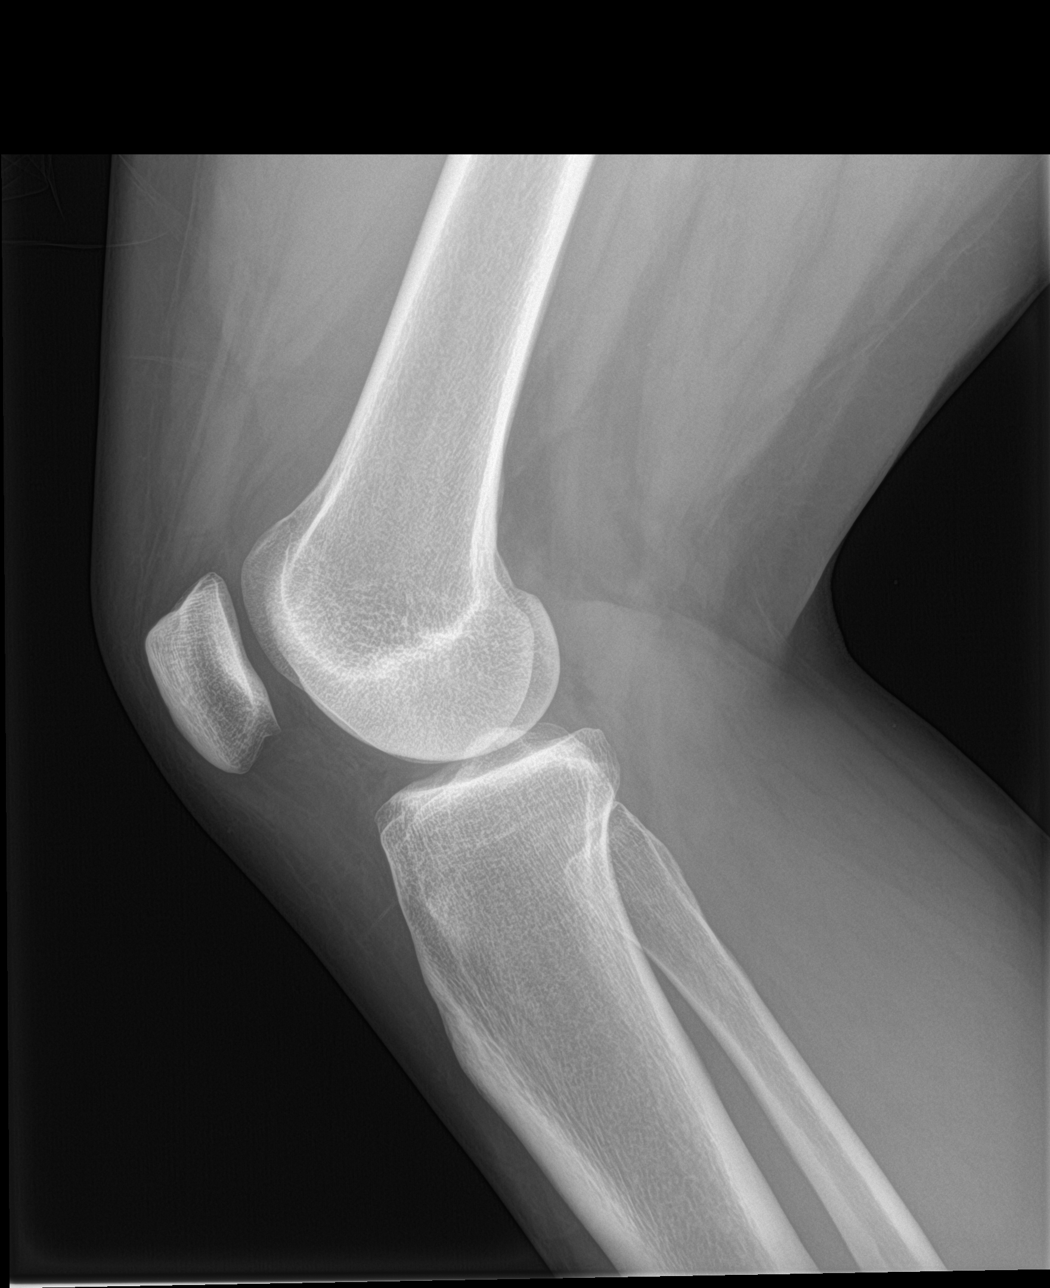

[knee obl (1 of 2)]
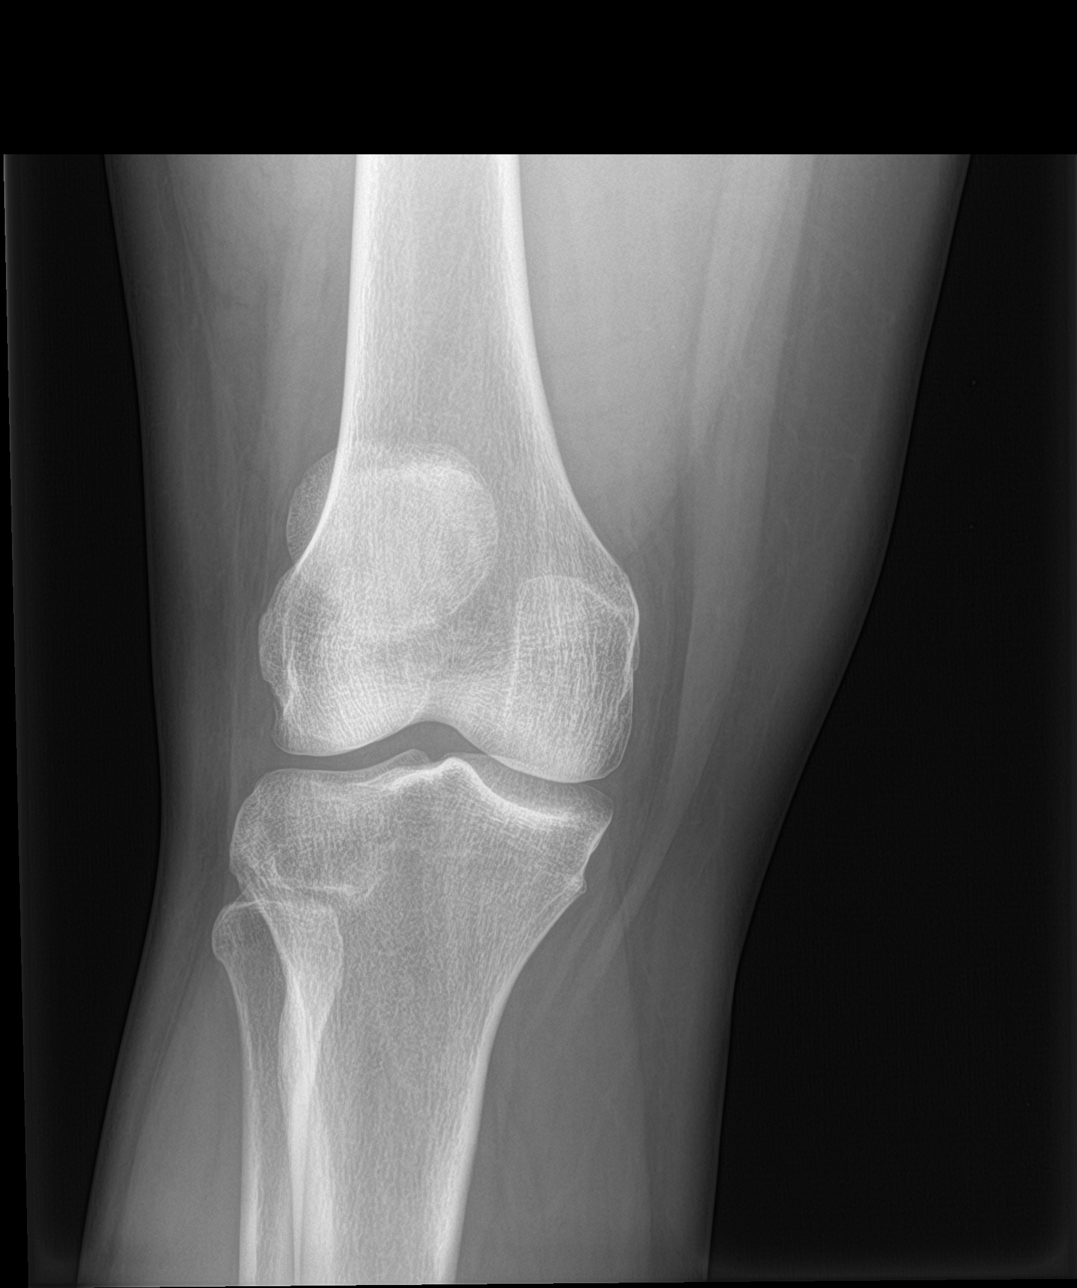

[knee obl (2 of 2)]
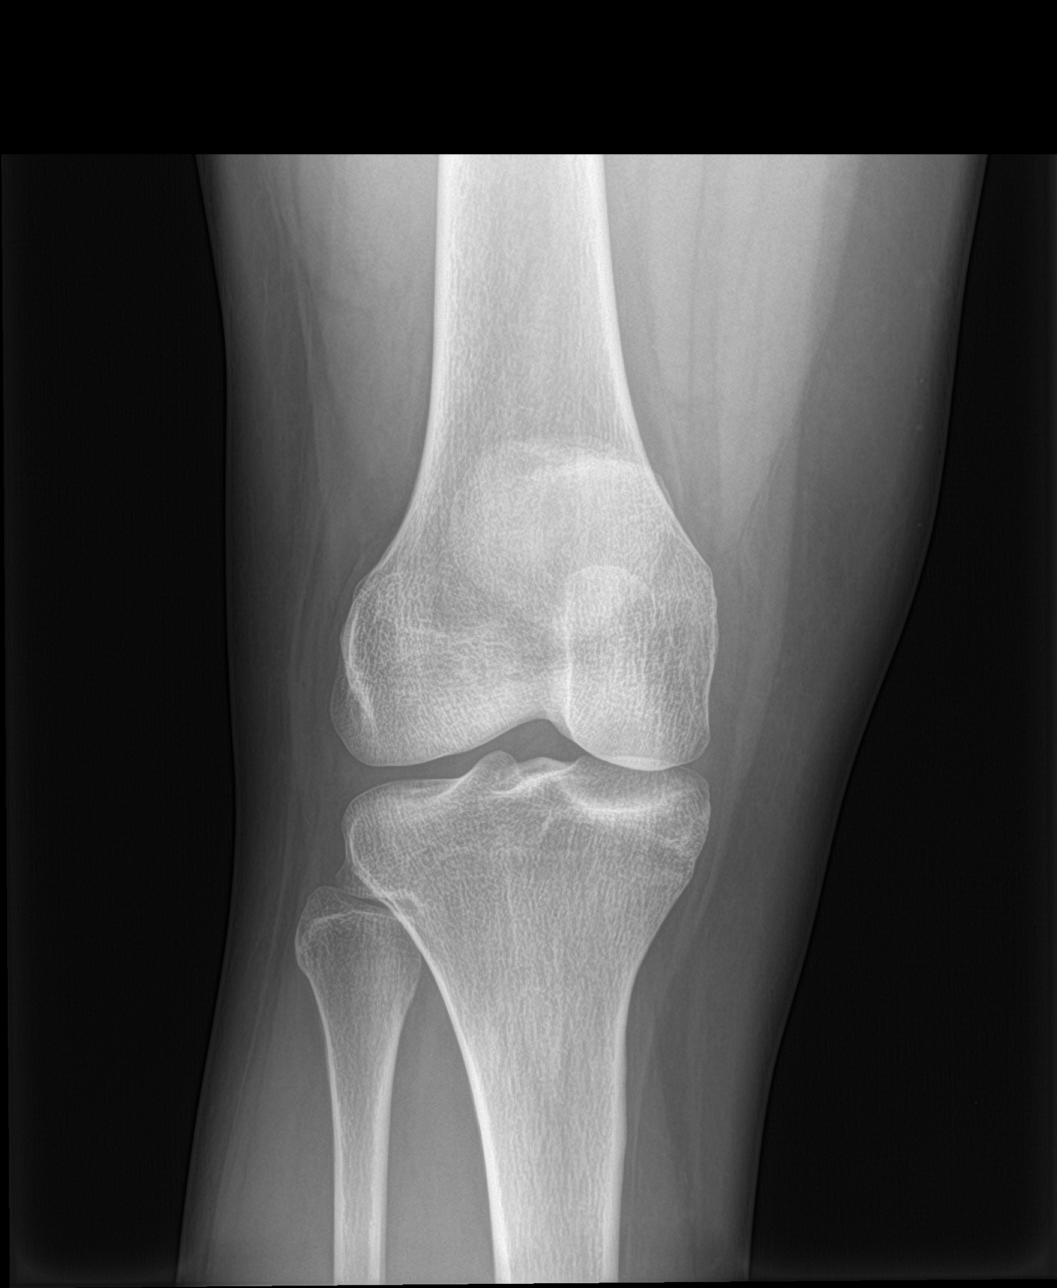

[4 of 4 positions shown; findings below may reference images not displayed]

FINDINGS: No evidence of fracture, dislocation, or joint effusion. No evidence
of arthropathy or other focal bone abnormality. Soft tissues are
unremarkable.
IMPRESSION: No acute abnormality noted.

## 2019-09-11 ENCOUNTER — Other Ambulatory Visit: Payer: Self-pay

## 2019-09-11 NOTE — Telephone Encounter (Signed)
Vyvanse prescription more than six months old. They have been cancelled with the pharmacy due to the written date.

## 2019-09-12 MED ORDER — LISDEXAMFETAMINE DIMESYLATE 60 MG PO CAPS: 60.0000 mg | ORAL_CAPSULE | ORAL | 0 refills | Status: DC

## 2019-11-28 ENCOUNTER — Telehealth (INDEPENDENT_AMBULATORY_CARE_PROVIDER_SITE_OTHER): Payer: 59 | Admitting: Family Medicine

## 2019-11-28 ENCOUNTER — Encounter: Payer: Self-pay | Admitting: Family Medicine

## 2019-11-28 DIAGNOSIS — H1032 Unspecified acute conjunctivitis, left eye: Secondary | ICD-10-CM | POA: Diagnosis not present

## 2019-11-28 DIAGNOSIS — H109 Unspecified conjunctivitis: Secondary | ICD-10-CM | POA: Insufficient documentation

## 2019-11-28 MED ORDER — CIPROFLOXACIN HCL 0.3 % OP SOLN: 2.0000 [drp] | Freq: Four times a day (QID) | OPHTHALMIC | 0 refills | Status: AC

## 2019-11-28 NOTE — Assessment & Plan Note (Signed)
Will treat as bacterial conjunctivitis and cover with cipro drops as she is a contact lens user.  Recommend avoiding contact lens use until resolution.  No pain or light sensitivity, I think corneal ulcer is less likely.  Discussed that if symptoms are worsening or not improving in the next 48-72 hours I would recommend she come in to be checked in person to be sure there are no complications related to contact lens use.

## 2019-11-28 NOTE — Progress Notes (Signed)
Eye is red and started itching last night.  Flushed eye out with eye drops. Changed contacts and left them out yesterday.

## 2019-11-28 NOTE — Progress Notes (Signed)
Hannah Martin - 35 y.o. female MRN [id number]  Date of birth: 1985-05-06   This visit type was conducted due to national recommendations for restrictions regarding the COVID-19 Pandemic (e.g. social distancing).  This format is felt to be most appropriate for this patient at this time.  All issues noted in this document were discussed and addressed.  No physical exam was performed (except for noted visual exam findings with Video Visits).  I discussed the limitations of evaluation and management by telemedicine and the availability of in person appointments. The patient expressed understanding and agreed to proceed.  I connected with@ on 11/28/19 at  1:40 PM EDT by a video enabled telemedicine application and verified that I am speaking with the correct person using two identifiers.  Present at visit: Everrett Coombe, DO [name]   Patient Location: Home [address] [place] [address]   Provider location:   [facility]  Chief Complaint  Patient presents with  . eye irritation    HPI  Hannah Martin is a 34 y.o. female who presents via audio/video conferencing for a telehealth visit today.  She has complaint today of L eye redness.  She reports that symptoms started yesterday.  Had some mild itching. She denies pain, headaches or congestion.  She has not had fever.  She has some discharge and matting.  She does use contact lenses and left these out yesterday.  She has some mildly blurred vision but no significant visual deficit.       ROS:  A comprehensive ROS was completed and negative except as noted per HPI  Past Medical History:  Diagnosis Date  . Asthma     History reviewed. No pertinent surgical history.  Family History  Problem Relation Age of Onset  . Diabetes Mother   . Anemia Mother   . Heart attack Maternal Grandmother     Social History   Socioeconomic History  . Marital status: Single    Spouse name: Not on file  . Number of children: Not  on file  . Years of education: Not on file  . Highest education level: Not on file  Occupational History  . Not on file  Tobacco Use  . Smoking status: Never Smoker  . Smokeless tobacco: Never Used  Vaping Use  . Vaping Use: Some days  Substance and Sexual Activity  . Alcohol use: Yes    Comment: 3 per week   . Drug use: No  . Sexual activity: Yes    Comment: straight   Other Topics Concern  . Not on file  Social History Narrative  . Not on file   Social Determinants of Health   Financial Resource Strain:   . Difficulty of Paying Living Expenses: Not on file  Food Insecurity:   . Worried About Programme researcher, broadcasting/film/video in the Last Year: Not on file  . Ran Out of Food in the Last Year: Not on file  Transportation Needs:   . Lack of Transportation (Medical): Not on file  . Lack of Transportation (Non-Medical): Not on file  Physical Activity:   . Days of Exercise per Week: Not on file  . Minutes of Exercise per Session: Not on file  Stress:   . Feeling of Stress : Not on file  Social Connections:   . Frequency of Communication with Friends and Family: Not on file  . Frequency of Social Gatherings with Friends and Family: Not on file  . Attends Religious Services: Not on file  .  Active Member of Clubs or Organizations: Not on file  . Attends Banker Meetings: Not on file  . Marital Status: Not on file  Intimate Partner Violence:   . Fear of Current or Ex-Partner: Not on file  . Emotionally Abused: Not on file  . Physically Abused: Not on file  . Sexually Abused: Not on file     Current Outpatient Medications:  .  albuterol (VENTOLIN HFA) 108 (90 Base) MCG/ACT inhaler, Inhale 1-2 puffs into the lungs every 6 (six) hours as needed for wheezing or shortness of breath., Disp: 1 Inhaler, Rfl: 6 .  lisdexamfetamine (VYVANSE) 60 MG capsule, Take 1 capsule (60 mg total) by mouth every morning., Disp: 30 capsule, Rfl: 0 .  ciprofloxacin (CILOXAN) 0.3 % ophthalmic  solution, Place 2 drops into the left eye in the morning, at noon, in the evening, and at bedtime for 7 days., Disp: 5 mL, Rfl: 0 .  lisdexamfetamine (VYVANSE) 60 MG capsule, Take 1 capsule (60 mg total) by mouth every morning., Disp: 30 capsule, Rfl: 0 .  lisdexamfetamine (VYVANSE) 60 MG capsule, Take 1 capsule (60 mg total) by mouth every morning., Disp: 30 capsule, Rfl: 0 .  lisdexamfetamine (VYVANSE) 60 MG capsule, Take 1 capsule (60 mg total) by mouth every morning., Disp: 30 capsule, Rfl: 0 .  lisdexamfetamine (VYVANSE) 60 MG capsule, Take 1 capsule (60 mg total) by mouth every morning., Disp: 30 capsule, Rfl: 0 .  lisdexamfetamine (VYVANSE) 60 MG capsule, Take 1 capsule (60 mg total) by mouth every morning., Disp: 30 capsule, Rfl: 0  EXAM:  VITALS per patient if applicable: Wt 178 lb (80.7 kg)   BMI 28.74 kg/m   GENERAL: alert, oriented, appears well and in no acute distress  HEENT: atraumatic, conjunctival injection, EOM normal,  no obvious abnormalities on inspection of external nose and ears  NECK: normal movements of the head and neck  LUNGS: on inspection no signs of respiratory distress, breathing rate appears normal, no obvious gross SOB, gasping or wheezing  CV: no obvious cyanosis  MS: moves all visible extremities without noticeable abnormality  PSYCH/NEURO: pleasant and cooperative, no obvious depression or anxiety, speech and thought processing grossly intact  ASSESSMENT AND PLAN:  Discussed the following assessment and plan:  Conjunctivitis Will treat as bacterial conjunctivitis and cover with cipro drops as she is a contact lens user.  Recommend avoiding contact lens use until resolution.  No pain or light sensitivity, I think corneal ulcer is less likely.  Discussed that if symptoms are worsening or not improving in the next 48-72 hours I would recommend she come in to be checked in person to be sure there are no complications related to contact lens use.       I discussed the assessment and treatment plan with the patient. The patient was provided an opportunity to ask questions and all were answered. The patient agreed with the plan and demonstrated an understanding of the instructions.   The patient was advised to call back or seek an in-person evaluation if the symptoms worsen or if the condition fails to improve as anticipated.    Everrett Coombe, DO

## 2019-12-14 ENCOUNTER — Encounter: Payer: Self-pay | Admitting: Osteopathic Medicine

## 2019-12-14 ENCOUNTER — Ambulatory Visit (INDEPENDENT_AMBULATORY_CARE_PROVIDER_SITE_OTHER): Payer: 59 | Admitting: Osteopathic Medicine

## 2019-12-14 DIAGNOSIS — F988 Other specified behavioral and emotional disorders with onset usually occurring in childhood and adolescence: Secondary | ICD-10-CM

## 2019-12-14 MED ORDER — LISDEXAMFETAMINE DIMESYLATE 60 MG PO CAPS: 60.0000 mg | ORAL_CAPSULE | ORAL | 0 refills | Status: DC

## 2019-12-14 NOTE — Progress Notes (Signed)
Nakeya Adinolfi is a 34 y.o. female who presents to  [facility] at [facility]  today, 12/14/19, seeking care for the following:  . Restart Vyvanse      ASSESSMENT & PLAN with other pertinent findings:  The encounter diagnosis was Attention deficit disorder (ADD) in adult.   Doing well today, no concerns  Would like to restart Vyvanse Rx   No results found for this or any previous visit (from the past 24 hour(s)).  There are no Patient Instructions on file for this visit.  No orders of the defined types were placed in this encounter.   Meds ordered this encounter  Medications  . lisdexamfetamine (VYVANSE) 60 MG capsule    Sig: Take 1 capsule (60 mg total) by mouth every morning.    Dispense:  30 capsule    Refill:  0  . lisdexamfetamine (VYVANSE) 60 MG capsule    Sig: Take 1 capsule (60 mg total) by mouth every morning.    Dispense:  30 capsule    Refill:  0  . lisdexamfetamine (VYVANSE) 60 MG capsule    Sig: Take 1 capsule (60 mg total) by mouth every morning.    Dispense:  30 capsule    Refill:  0  . lisdexamfetamine (VYVANSE) 60 MG capsule    Sig: Take 1 capsule (60 mg total) by mouth every morning.    Dispense:  30 capsule    Refill:  0  . lisdexamfetamine (VYVANSE) 60 MG capsule    Sig: Take 1 capsule (60 mg total) by mouth every morning.    Dispense:  30 capsule    Refill:  0  . lisdexamfetamine (VYVANSE) 60 MG capsule    Sig: Take 1 capsule (60 mg total) by mouth every morning.    Dispense:  30 capsule    Refill:  0       Follow-up instructions: Return in about 6 months (around 06/12/2020) for [employer].                                         BP 119/75 (BP Location: Right Arm, Patient Position: Sitting)   Pulse 71   Wt 179 lb (81.2 kg)   SpO2 100%   BMI 28.91 kg/m   No outpatient medications have been marked as taking for the 12/14/19 encounter (Office Visit)  with Sunnie Nielsen, DO.    No results found for this or any previous visit (from the past 72 hour(s)).  No results found.     All questions at time of visit were answered - patient instructed to contact office with any additional concerns or updates.  ER/RTC precautions were reviewed with the patient as applicable.   Please note: voice recognition software was used to produce this document, and typos may escape review. Please contact Dr. Lyn Hollingshead for any needed clarifications.   Total encounter time: 15 minutes.

## 2019-12-28 ENCOUNTER — Other Ambulatory Visit: Payer: Self-pay | Admitting: Osteopathic Medicine

## 2019-12-28 DIAGNOSIS — Z8709 Personal history of other diseases of the respiratory system: Secondary | ICD-10-CM

## 2020-01-22 ENCOUNTER — Telehealth: Payer: Self-pay

## 2020-01-22 ENCOUNTER — Encounter: Payer: Self-pay | Admitting: Osteopathic Medicine

## 2020-01-22 MED ORDER — LISDEXAMFETAMINE DIMESYLATE 60 MG PO CAPS: 60.0000 mg | ORAL_CAPSULE | ORAL | 0 refills | Status: DC

## 2020-01-22 NOTE — Telephone Encounter (Signed)
Kavitha called and states the October prescription for Vyvanse is not at the pharmacy. I called and confirmed. The pharmacy doesn't have a prescription for October.

## 2020-01-22 NOTE — Telephone Encounter (Signed)
Sent Rx for next 3 months, see patient MyChart message sent from me

## 2020-02-27 ENCOUNTER — Other Ambulatory Visit: Payer: Self-pay

## 2020-02-27 DIAGNOSIS — F988 Other specified behavioral and emotional disorders with onset usually occurring in childhood and adolescence: Secondary | ICD-10-CM

## 2020-02-27 MED ORDER — LISDEXAMFETAMINE DIMESYLATE 60 MG PO CAPS: 60.0000 mg | ORAL_CAPSULE | ORAL | 0 refills | Status: DC

## 2020-02-27 NOTE — Telephone Encounter (Signed)
Hannah Martin would like the Vyvanse prescription sent to the [facility] in [place]. I cancelled all other prescriptions with [facility] in [place]. Only 3 prescriptions can be sent in at a time. More than 90 days will not be placed on hold.

## 2020-04-05 ENCOUNTER — Telehealth: Payer: Self-pay | Admitting: Neurology

## 2020-04-05 NOTE — Telephone Encounter (Signed)
Prior Authorization request  for Vyvanse received from pharmacy. Insuranse not correct in chart, can we call and get updated insurance information?

## 2020-04-05 NOTE — Telephone Encounter (Signed)
Called patient and patient said they would email insurance, awaiting that email. AM

## 2020-04-16 NOTE — Telephone Encounter (Signed)
I tried to submit it with the new insurance information and it still can not locate member. I called and left a message for patient to call us back to confirm address and phone number.

## 2020-04-16 NOTE — Telephone Encounter (Signed)
Arlyne called back and gave new address information. PA was approved.

## 2020-05-14 ENCOUNTER — Telehealth: Payer: Self-pay | Admitting: Osteopathic Medicine

## 2020-05-14 DIAGNOSIS — F988 Other specified behavioral and emotional disorders with onset usually occurring in childhood and adolescence: Secondary | ICD-10-CM

## 2020-05-14 NOTE — Telephone Encounter (Signed)
Pt called and left a Voicemail requesting a refill on  lisdexamfetamine (VYVANSE) 60 MG capsule, she was here last in September for a med f/u on this and her 6 month f/u scheduled for March as planned.

## 2020-05-15 MED ORDER — LISDEXAMFETAMINE DIMESYLATE 60 MG PO CAPS: 60.0000 mg | ORAL_CAPSULE | ORAL | 0 refills | Status: DC

## 2020-05-15 NOTE — Telephone Encounter (Signed)
Was sent to [facility] prior to this, pelase confirm w/ pharmacy     Start Date: 05/12/20 End Date: 06/11/20 after 30 doses  Written Date: 12/14/19 Expiration Date: 06/11/20  Earliest Fill Date: 05/12/20

## 2020-05-15 NOTE — Telephone Encounter (Signed)
Called pharmacy, stated they did not receive Rx.

## 2020-05-15 NOTE — Telephone Encounter (Signed)
Ok resent

## 2020-06-13 ENCOUNTER — Encounter: Payer: Self-pay | Admitting: Osteopathic Medicine

## 2020-06-13 ENCOUNTER — Other Ambulatory Visit: Payer: Self-pay

## 2020-06-13 ENCOUNTER — Ambulatory Visit (INDEPENDENT_AMBULATORY_CARE_PROVIDER_SITE_OTHER): Payer: BC Managed Care – PPO | Admitting: Osteopathic Medicine

## 2020-06-13 ENCOUNTER — Other Ambulatory Visit (HOSPITAL_COMMUNITY)
Admission: RE | Admit: 2020-06-13 | Discharge: 2020-06-13 | Disposition: A | Discharge status: Home / Self Care | Payer: BC Managed Care – PPO | Source: Ambulatory Visit | Attending: Osteopathic Medicine | Admitting: Osteopathic Medicine

## 2020-06-13 VITALS — BP 127/68 | HR 76 | Temp 97.9°F | Wt 167.1 lb

## 2020-06-13 DIAGNOSIS — J4531 Mild persistent asthma with (acute) exacerbation: Secondary | ICD-10-CM

## 2020-06-13 DIAGNOSIS — Z113 Encounter for screening for infections with a predominantly sexual mode of transmission: Secondary | ICD-10-CM

## 2020-06-13 DIAGNOSIS — Z124 Encounter for screening for malignant neoplasm of cervix: Secondary | ICD-10-CM | POA: Diagnosis not present

## 2020-06-13 DIAGNOSIS — F988 Other specified behavioral and emotional disorders with onset usually occurring in childhood and adolescence: Secondary | ICD-10-CM | POA: Diagnosis not present

## 2020-06-13 MED ORDER — LISDEXAMFETAMINE DIMESYLATE 60 MG PO CAPS: 60.0000 mg | ORAL_CAPSULE | ORAL | 0 refills | Status: DC

## 2020-06-13 NOTE — Patient Instructions (Signed)
General Preventive Care  Most recent routine screening labs: done today.   Blood pressure goal 130/80 or less.   Tobacco: don't! Please let me know if you need help quitting!  Alcohol: responsible moderation is ok for most adults - if you have concerns about your alcohol intake, please talk to me!   Exercise: as tolerated to reduce risk of cardiovascular disease and diabetes. Strength training will also prevent osteoporosis.   Mental health: if need for mental health care (medicines, counseling, other), or concerns about moods, please let me know!   Sexual / Reproductive health: if need for STD testing, or if concerns with libido/pain problems, please let me know! If you need to discuss family planning, please let me know!   Advanced Directive: Living Will and/or Healthcare Power of Attorney recommended for all adults, regardless of age or health.  Vaccines  Flu vaccine: for almost everyone, every fall.   Shingles vaccine: after age 22.   Pneumonia vaccines: after age 67.  Tetanus booster: every 10 years (due 2029) / 3rd trimester of pregnancy  COVID vaccine: STRONGLY RECOMMENDED  Cancer screenings   Colon cancer screening: for everyone age 78-75.  Breast cancer screening: mammogram at age 23  Cervical cancer screening: Pap every 5 years if normal.    Lung cancer screening: not needed for non-smokers  Infection screenings  . HIV: recommended screening at least once age 66-65 . Gonorrhea/Chlamydia: screening as needed . Hepatitis C: recommended once for everyone age 40-75 . TB: at-risk, or depending on work requirements and/or travel history

## 2020-06-13 NOTE — Progress Notes (Signed)
Hannah Martin is a 35 y.o. female who presents to  [facility] at [facility]  today, 06/13/20, seeking care for the following:  . ANNUAL PHYSICAL  . ADHD RX REFILL  . PAP . ROUTINE STI SCREENING       ASSESSMENT & PLAN with other pertinent findings:  The primary encounter diagnosis was Screening for STDs (sexually transmitted diseases). Diagnoses of Attention deficit disorder (ADD) in adult, Mild persistent asthma with acute exacerbation, and Cervical cancer screening were also pertinent to this visit.    Patient Instructions  General Preventive Care  Most recent routine screening labs: done today.   Blood pressure goal 130/80 or less.   Tobacco: don't! Please let me know if you need help quitting!  Alcohol: responsible moderation is ok for most adults - if you have concerns about your alcohol intake, please talk to me!   Exercise: as tolerated to reduce risk of cardiovascular disease and diabetes. Strength training will also prevent osteoporosis.   Mental health: if need for mental health care (medicines, counseling, other), or concerns about moods, please let me know!   Sexual / Reproductive health: if need for STD testing, or if concerns with libido/pain problems, please let me know! If you need to discuss family planning, please let me know!   Advanced Directive: Living Will and/or Healthcare Power of Attorney recommended for all adults, regardless of age or health.  Vaccines  Flu vaccine: for almost everyone, every fall.   Shingles vaccine: after age 64.   Pneumonia vaccines: after age 63.  Tetanus booster: every 10 years (due 2029) / 3rd trimester of pregnancy  COVID vaccine: STRONGLY RECOMMENDED  Cancer screenings   Colon cancer screening: for everyone age 75-75.  Breast cancer screening: mammogram at age 65  Cervical cancer screening: Pap every 5 years if normal.    Lung cancer screening: not needed for  non-smokers  Infection screenings  . HIV: recommended screening at least once age 19-65 . Gonorrhea/Chlamydia: screening as needed . Hepatitis C: recommended once for everyone age 73-75 . TB: at-risk, or depending on work requirements and/or travel history   Orders Placed This Encounter  Procedures  . C. trachomatis/N. gonorrhoeae RNA  . Trichomonas vaginalis, RNA  . CBC  . COMPLETE METABOLIC PANEL WITH GFR  . Lipid panel  . HIV Antibody (routine testing w rflx)  . Hepatitis C antibody  . Hepatitis B surface antigen  . Hepatitis B core antibody, total  . RPR    Meds ordered this encounter  Medications  . lisdexamfetamine (VYVANSE) 60 MG capsule    Sig: Take 1 capsule (60 mg total) by mouth every morning.    Dispense:  30 capsule    Refill:  0  . lisdexamfetamine (VYVANSE) 60 MG capsule    Sig: Take 1 capsule (60 mg total) by mouth every morning.    Dispense:  30 capsule    Refill:  0  . lisdexamfetamine (VYVANSE) 60 MG capsule    Sig: Take 1 capsule (60 mg total) by mouth every morning.    Dispense:  30 capsule    Refill:  0  . lisdexamfetamine (VYVANSE) 60 MG capsule    Sig: Take 1 capsule (60 mg total) by mouth every morning.    Dispense:  30 capsule    Refill:  0  . lisdexamfetamine (VYVANSE) 60 MG capsule    Sig: Take 1 capsule (60 mg total) by mouth every morning.    Dispense:  30 capsule    Refill:  0  . lisdexamfetamine (VYVANSE) 60 MG capsule    Sig: Take 1 capsule (60 mg total) by mouth every morning.    Dispense:  30 capsule    Refill:  0     See below for relevant physical exam findings  See below for recent lab and imaging results reviewed  Medications, allergies, PMH, PSH, SocH, FamH reviewed below    Follow-up instructions: Return in about 6 months (around 12/14/2020) for ADHD RX FOLLOW-UP, SEE [place] SOONER IF NEEDED. CALL/MESSAGE W/ QUESTIONS.                                        Exam:  BP 127/68 (BP  Location: Left Arm, Patient Position: Sitting, Cuff Size: Normal)   Pulse 76   Temp 97.9 F (36.6 C) (Oral)   Wt 167 lb 1.3 oz (75.8 kg)   BMI 26.98 kg/m   Constitutional: VS see above. General Appearance: alert, well-developed, well-nourished, NAD  Neck: No masses, trachea midline.   Respiratory: Normal respiratory effort. no wheeze, no rhonchi, no rales  Cardiovascular: S1/S2 normal, no murmur, no rub/gallop auscultated. RRR.   Musculoskeletal: Gait normal. Symmetric and independent movement of all extremities  Abdominal: non-tender, non-distended, no appreciable organomegaly, neg Murphy's, BS WNLx4  Neurological: Normal balance/coordination. No tremor.  Skin: warm, dry, intact.  Psychiatric: Normal judgment/insight. Normal mood and affect. Oriented x3.  GYN: No lesions/ulcers to external genitalia, normal urethra, normal vaginal mucosa, physiologic discharge, cervix normal without lesions, uterus not enlarged or tender, adnexa no masses and nontender  BREAST: No rashes/skin changes, normal fibrous breast tissue, no masses or tenderness, normal nipple without discharge, normal axilla    Current Meds  Medication Sig  . albuterol (VENTOLIN HFA) 108 (90 Base) MCG/ACT inhaler INHALE 1 TO 2 PUFFS INTO LUNGS EVERY 6 HOURS AS NEEDED FOR WHEEZING OR SHORTNESS OF BREATH  . [DISCONTINUED] lisdexamfetamine (VYVANSE) 60 MG capsule Take 1 capsule (60 mg total) by mouth every morning.    No Known Allergies  Patient Active Problem List   Diagnosis Date Noted  . Conjunctivitis 11/28/2019  . Mild persistent asthma with acute exacerbation 09/22/2017  . Chronic pain of right knee 06/04/2017  . Attention deficit disorder (ADD) in adult 06/24/2016  . Menorrhagia with regular cycle 11/13/2015  . Easy bruising 11/13/2015  . Routine screening for STI (sexually transmitted infection) 11/13/2015  . History of ADHD 11/12/2015  . History of asthma 11/12/2015    Family History  Problem  Relation Age of Onset  . Diabetes Mother   . Anemia Mother   . Heart attack Maternal Grandmother     Social History   Tobacco Use  Smoking Status Never Smoker  Smokeless Tobacco Never Used    No past surgical history on file.  Immunization History  Administered Date(s) Administered  . Tdap 01/06/2018    No results found for this or any previous visit (from the past 2160 hour(s)).  No results found.     All questions at time of visit were answered - patient instructed to contact office with any additional concerns or updates. ER/RTC precautions were reviewed with the patient as applicable.   Please note: manual typing as well as voice recognition software may have been used to produce this document - typos may escape review. Please contact Dr. Lyn Hollingshead for any needed clarifications.

## 2020-06-14 ENCOUNTER — Other Ambulatory Visit: Payer: Self-pay | Admitting: Family Medicine

## 2020-06-14 LAB — CBC
HCT: 37.8 % (ref 35.0–45.0)
Hemoglobin: 12.5 g/dL (ref 11.7–15.5)
MCH: 28.6 pg (ref 27.0–33.0)
MCHC: 33.1 g/dL (ref 32.0–36.0)
MCV: 86.5 fL (ref 80.0–100.0)
MPV: 10.8 fL (ref 7.5–12.5)
Platelets: 306 10*3/uL (ref 140–400)
RBC: 4.37 10*6/uL (ref 3.80–5.10)
RDW: 12.9 % (ref 11.0–15.0)
WBC: 3.8 10*3/uL (ref 3.8–10.8)

## 2020-06-14 LAB — TRICHOMONAS VAGINALIS, PROBE AMP: Trichomonas vaginalis RNA: DETECTED — AB

## 2020-06-14 LAB — COMPLETE METABOLIC PANEL WITH GFR
AG Ratio: 1.7 (calc) (ref 1.0–2.5)
ALT: 11 U/L (ref 6–29)
AST: 13 U/L (ref 10–30)
Albumin: 4.3 g/dL (ref 3.6–5.1)
Alkaline phosphatase (APISO): 47 U/L (ref 31–125)
BUN: 18 mg/dL (ref 7–25)
CO2: 22 mmol/L (ref 20–32)
Calcium: 9.7 mg/dL (ref 8.6–10.2)
Chloride: 107 mmol/L (ref 98–110)
Creat: 0.83 mg/dL (ref 0.50–1.10)
GFR, Est African American: 108 mL/min/{1.73_m2} (ref >=60)
GFR, Est Non African American: 93 mL/min/{1.73_m2} (ref >=60)
Globulin: 2.5 g/dL (calc) (ref 1.9–3.7)
Glucose, Bld: 68 mg/dL (ref 65–99)
Potassium: 5 mmol/L (ref 3.5–5.3)
Sodium: 138 mmol/L (ref 135–146)
Total Bilirubin: 1.4 mg/dL — ABNORMAL HIGH (ref 0.2–1.2)
Total Protein: 6.8 g/dL (ref 6.1–8.1)

## 2020-06-14 LAB — CYTOLOGY - PAP
Comment: NEGATIVE
Diagnosis: NEGATIVE
High risk HPV: NEGATIVE

## 2020-06-14 LAB — C. TRACHOMATIS/N. GONORRHOEAE RNA
C. trachomatis RNA, TMA: NOT DETECTED
C. trachomatis RNA, TMA: NOT DETECTED
N. gonorrhoeae RNA, TMA: NOT DETECTED
N. gonorrhoeae RNA, TMA: NOT DETECTED

## 2020-06-14 LAB — HIV ANTIBODY (ROUTINE TESTING W REFLEX): HIV 1&2 Ab, 4th Generation: NONREACTIVE

## 2020-06-14 LAB — HEPATITIS B CORE ANTIBODY, TOTAL: Hep B Core Total Ab: NONREACTIVE

## 2020-06-14 MED ORDER — METRONIDAZOLE 500 MG PO TABS: 500.0000 mg | ORAL_TABLET | Freq: Two times a day (BID) | ORAL | 0 refills | Status: AC

## 2020-06-14 NOTE — Progress Notes (Signed)
flagy

## 2020-06-15 LAB — TRICHOMONAS VAGINALIS, PROBE AMP: Trichomonas vaginalis RNA: DETECTED — AB

## 2020-06-17 ENCOUNTER — Encounter: Payer: Self-pay | Admitting: Osteopathic Medicine

## 2020-06-17 ENCOUNTER — Telehealth: Payer: Self-pay

## 2020-06-17 NOTE — Telephone Encounter (Signed)
MyChart emssage to pt

## 2020-06-17 NOTE — Telephone Encounter (Signed)
FYI - The pharmacy called and states the Vyvanse can only be sent in for 90 days. The last 3 prescriptions will not be put on hold.

## 2020-07-30 ENCOUNTER — Other Ambulatory Visit: Payer: Self-pay

## 2020-08-01 MED ORDER — LISDEXAMFETAMINE DIMESYLATE 60 MG PO CAPS: 60.0000 mg | ORAL_CAPSULE | ORAL | 0 refills | Status: DC

## 2020-11-08 ENCOUNTER — Other Ambulatory Visit: Payer: Self-pay

## 2020-11-08 DIAGNOSIS — F988 Other specified behavioral and emotional disorders with onset usually occurring in childhood and adolescence: Secondary | ICD-10-CM

## 2020-11-08 MED ORDER — LISDEXAMFETAMINE DIMESYLATE 60 MG PO CAPS: 60.0000 mg | ORAL_CAPSULE | ORAL | 0 refills | Status: DC

## 2020-11-08 NOTE — Telephone Encounter (Signed)
The pharmacy can only keep 3 prescriptions on file. She needs another prescription for the Vyvanse.

## 2020-12-07 DIAGNOSIS — H44001 Unspecified purulent endophthalmitis, right eye: Secondary | ICD-10-CM | POA: Diagnosis not present

## 2020-12-07 DIAGNOSIS — Z973 Presence of spectacles and contact lenses: Secondary | ICD-10-CM | POA: Diagnosis not present

## 2020-12-07 DIAGNOSIS — H5789 Other specified disorders of eye and adnexa: Secondary | ICD-10-CM | POA: Diagnosis not present

## 2020-12-10 DIAGNOSIS — H18229 Idiopathic corneal edema, unspecified eye: Secondary | ICD-10-CM | POA: Diagnosis not present

## 2020-12-19 ENCOUNTER — Ambulatory Visit: Payer: BC Managed Care – PPO | Admitting: Osteopathic Medicine

## 2020-12-26 ENCOUNTER — Encounter: Payer: Self-pay | Admitting: Family Medicine

## 2020-12-26 ENCOUNTER — Ambulatory Visit (INDEPENDENT_AMBULATORY_CARE_PROVIDER_SITE_OTHER): Payer: BC Managed Care – PPO | Admitting: Family Medicine

## 2020-12-26 ENCOUNTER — Other Ambulatory Visit: Payer: Self-pay

## 2020-12-26 VITALS — BP 128/64 | HR 84 | Temp 98.0°F | Ht 66.0 in | Wt 175.0 lb

## 2020-12-26 DIAGNOSIS — F988 Other specified behavioral and emotional disorders with onset usually occurring in childhood and adolescence: Secondary | ICD-10-CM | POA: Diagnosis not present

## 2020-12-26 MED ORDER — LISDEXAMFETAMINE DIMESYLATE 60 MG PO CAPS: 60.0000 mg | ORAL_CAPSULE | ORAL | 0 refills | Status: DC

## 2020-12-26 NOTE — Progress Notes (Signed)
Hannah Martin - 35 y.o. female MRN [id number]  Date of birth: 04/02/1985  Subjective Chief Complaint  Patient presents with   ADHD    HPI Hannah Martin is a 35 year old female here today for follow-up of ADHD.  This is currently treated with Vyvanse 60 mg daily.  She has been on this for a few years.  She reports that this continues to work well for her.  She has not had any notable side effects related to this including problems with insomnia, appetite or increased anxiety.  ROS:  A comprehensive ROS was completed and negative except as noted per HPI  No Known Allergies  Past Medical History:  Diagnosis Date   Asthma     History reviewed. No pertinent surgical history.  Social History   Socioeconomic History   Marital status: Single    Spouse name: Not on file   Number of children: Not on file   Years of education: Not on file   Highest education level: Not on file  Occupational History   Not on file  Tobacco Use   Smoking status: Never   Smokeless tobacco: Never  Vaping Use   Vaping Use: Some days  Substance and Sexual Activity   Alcohol use: Yes    Comment: 3 per week    Drug use: No   Sexual activity: Yes    Comment: straight   Other Topics Concern   Not on file  Social History Narrative   Not on file   Social Determinants of Health   Financial Resource Strain: Not on file  Food Insecurity: Not on file  Transportation Needs: Not on file  Physical Activity: Not on file  Stress: Not on file  Social Connections: Not on file    Family History  Problem Relation Age of Onset   Diabetes Mother    Anemia Mother    Heart attack Maternal Grandmother     Health Maintenance  Topic Date Due   COVID-19 Vaccine (1) Never done   Hepatitis C Screening  Never done   INFLUENZA VACCINE  06/27/2021 (Originally 10/28/2020)   PAP SMEAR-Modifier  06/14/2023   TETANUS/TDAP  01/07/2028   HIV Screening  Completed   HPV VACCINES  Aged Out      ----------------------------------------------------------------------------------------------------------------------------------------------------------------------------------------------------------------- Physical Exam BP 128/64 (BP Location: Left Arm, Patient Position: Sitting)   Pulse 84   Temp 98 F (36.7 C)   Ht 5\' 6"  (1.676 m)   Wt 175 lb (79.4 kg)   SpO2 100%   BMI 28.25 kg/m   Physical Exam Constitutional:      Appearance: Normal appearance.  Eyes:     General: No scleral icterus. Cardiovascular:     Rate and Rhythm: Normal rate and regular rhythm.  Pulmonary:     Effort: Pulmonary effort is normal.     Breath sounds: Normal breath sounds.  Musculoskeletal:     Cervical back: Neck supple.  Neurological:     General: No focal deficit present.     Mental Status: She is alert.  Psychiatric:        Mood and Affect: Mood normal.        Behavior: Behavior normal.    ------------------------------------------------------------------------------------------------------------------------------------------------------------------------------------------------------------------- Assessment and Plan  Attention deficit disorder (ADD) in adult She continues to do well with Vyvanse at current strength.  We will plan to continue at 60 mg daily.  Updated prescription sent in.  We will plan to follow-up in 6 months.   Meds ordered this encounter  Medications  DISCONTD: lisdexamfetamine (VYVANSE) 60 MG capsule    Sig: Take 1 capsule (60 mg total) by mouth every morning. Fill in 60 days    Dispense:  30 capsule    Refill:  0   DISCONTD: lisdexamfetamine (VYVANSE) 60 MG capsule    Sig: Take 1 capsule (60 mg total) by mouth every morning. Fill in 30 days    Dispense:  30 capsule    Refill:  0   DISCONTD: lisdexamfetamine (VYVANSE) 60 MG capsule    Sig: Take 1 capsule (60 mg total) by mouth every morning. Fill now    Dispense:  30 capsule    Refill:  0    lisdexamfetamine (VYVANSE) 60 MG capsule    Sig: Take 1 capsule (60 mg total) by mouth every morning. Fill in 60 days    Dispense:  30 capsule    Refill:  0   lisdexamfetamine (VYVANSE) 60 MG capsule    Sig: Take 1 capsule (60 mg total) by mouth every morning. Fill in 30 days    Dispense:  30 capsule    Refill:  0   lisdexamfetamine (VYVANSE) 60 MG capsule    Sig: Take 1 capsule (60 mg total) by mouth every morning. Fill now    Dispense:  30 capsule    Refill:  0    Return in about 6 months (around 06/25/2021) for ADHD.    This visit occurred during the SARS-CoV-2 public health emergency.  Safety protocols were in place, including screening questions prior to the visit, additional usage of staff PPE, and extensive cleaning of exam room while observing appropriate contact time as indicated for disinfecting solutions.

## 2020-12-26 NOTE — Assessment & Plan Note (Signed)
She continues to do well with Vyvanse at current strength.  We will plan to continue at 60 mg daily.  Updated prescription sent in.  We will plan to follow-up in 6 months.

## 2021-02-06 DIAGNOSIS — F4323 Adjustment disorder with mixed anxiety and depressed mood: Secondary | ICD-10-CM | POA: Diagnosis not present

## 2021-02-27 DIAGNOSIS — F4323 Adjustment disorder with mixed anxiety and depressed mood: Secondary | ICD-10-CM | POA: Diagnosis not present

## 2021-03-06 ENCOUNTER — Ambulatory Visit
Admission: EM | Admit: 2021-03-06 | Discharge: 2021-03-06 | Disposition: A | Discharge status: Home / Self Care | Payer: BC Managed Care – PPO | Attending: Emergency Medicine | Admitting: Emergency Medicine

## 2021-03-06 ENCOUNTER — Encounter: Payer: Self-pay | Admitting: Emergency Medicine

## 2021-03-06 ENCOUNTER — Other Ambulatory Visit: Payer: Self-pay

## 2021-03-06 DIAGNOSIS — B349 Viral infection, unspecified: Secondary | ICD-10-CM | POA: Diagnosis not present

## 2021-03-06 DIAGNOSIS — Z1152 Encounter for screening for COVID-19: Secondary | ICD-10-CM | POA: Diagnosis not present

## 2021-03-06 NOTE — ED Provider Notes (Signed)
[facility] Hannah Martin    CSN: [id number] Arrival date & time: 03/06/21  1044      History   Chief Complaint Chief Complaint  Patient presents with   Headache   Cough   Sore Throat   Generalized Body Aches    HPI Hannah Martin is a 35 y.o. female.  Patient presents with 3 to 4-day history of fever, body aches, headache, congestion, cough.  She felt warm but has not taken her temperature.  She denies rash, shortness of breath vomiting, diarrhea, or symptoms.  Treatment at home with albuterol inhaler and OTC cold medication.  Her medical history includes asthma.  The history is provided by the patient and medical records.   Past Medical History:  Diagnosis Date   Asthma     Patient Active Problem List   Diagnosis Date Noted   Conjunctivitis 11/28/2019   Mild persistent asthma with acute exacerbation 09/22/2017   Chronic pain of right knee 06/04/2017   Attention deficit disorder (ADD) in adult 06/24/2016   Menorrhagia with regular cycle 11/13/2015   Easy bruising 11/13/2015   Routine screening for STI (sexually transmitted infection) 11/13/2015   History of ADHD 11/12/2015   History of asthma 11/12/2015    History reviewed. No pertinent surgical history.  OB History   No obstetric history on file.      Home Medications    Prior to Admission medications   Medication Sig Start Date End Date Taking? Authorizing Provider  albuterol (VENTOLIN HFA) 108 (90 Base) MCG/ACT inhaler INHALE 1 TO 2 PUFFS INTO LUNGS EVERY 6 HOURS AS NEEDED FOR WHEEZING OR SHORTNESS OF BREATH 12/28/19  Yes Sunnie Nielsen, DO  lisdexamfetamine (VYVANSE) 60 MG capsule Take 1 capsule (60 mg total) by mouth every morning. Fill in 60 days 12/26/20 03/06/21 Yes Everrett Coombe, DO  lisdexamfetamine (VYVANSE) 60 MG capsule Take 1 capsule (60 mg total) by mouth every morning. Fill in 30 days 12/26/20 01/25/21  Everrett Coombe, DO  lisdexamfetamine (VYVANSE) 60 MG capsule Take 1 capsule (60 mg total) by  mouth every morning. Fill now 12/26/20 01/25/21  Everrett Coombe, DO    Family History Family History  Problem Relation Age of Onset   Diabetes Mother    Anemia Mother    Heart attack Maternal Grandmother     Social History Social History   Tobacco Use   Smoking status: Never   Smokeless tobacco: Never  Vaping Use   Vaping Use: Former  Substance Use Topics   Alcohol use: Yes    Comment: 3 per week    Drug use: No     Allergies   Patient has no known allergies.   Review of Systems Review of Systems  Constitutional:  Positive for fever. Negative for chills.  HENT:  Positive for congestion, rhinorrhea and sore throat. Negative for ear pain.   Respiratory:  Positive for cough. Negative for shortness of breath.   Cardiovascular:  Negative for chest pain and palpitations.  Gastrointestinal:  Negative for diarrhea and vomiting.  Skin:  Negative for color change and rash.  Neurological:  Positive for headaches.  All other systems reviewed and are negative.   Physical Exam Triage Vital Signs ED Triage Vitals  Enc Vitals Group     BP      Pulse      Resp      Temp      Temp src      SpO2      Weight  Height      Head Circumference      Peak Flow      Pain Score      Pain Loc      Pain Edu?      Excl. in GC?    No data found.  Updated Vital Signs BP 119/80 (BP Location: Left Arm)   Pulse 94   Temp 98.3 F (36.8 C) (Oral)   Resp 18   LMP 03/02/2021   SpO2 98%   Visual Acuity Right Eye Distance:   Left Eye Distance:   Bilateral Distance:    Right Eye Near:   Left Eye Near:    Bilateral Near:     Physical Exam Vitals and nursing note reviewed.  Constitutional:      General: She is not in acute distress.    Appearance: She is well-developed. She is not ill-appearing.  HENT:     Right Ear: Tympanic membrane normal.     Left Ear: Tympanic membrane normal.     Nose: Rhinorrhea present.     Mouth/Throat:     Mouth: Mucous membranes are moist.      Pharynx: Oropharynx is clear.  Cardiovascular:     Rate and Rhythm: Normal rate and regular rhythm.     Heart sounds: Normal heart sounds.  Pulmonary:     Effort: Pulmonary effort is normal. No respiratory distress.     Breath sounds: Normal breath sounds. No wheezing.  Abdominal:     Palpations: Abdomen is soft.     Tenderness: There is no abdominal tenderness.  Musculoskeletal:     Cervical back: Neck supple.  Skin:    General: Skin is warm and dry.  Neurological:     Mental Status: She is alert.  Psychiatric:        Mood and Affect: Mood normal.        Behavior: Behavior normal.     UC Treatments / Results  Labs (all labs ordered are listed, but only abnormal results are displayed) Labs Reviewed  COVID-19, FLU A+B NAA    EKG   Radiology No results found.  Procedures Procedures (including critical care time)  Medications Ordered in UC Medications - No data to display  Initial Impression / Assessment and Plan / UC Course  I have reviewed the triage vital signs and the nursing notes.  Pertinent labs & imaging results that were available during my care of the patient were reviewed by me and considered in my medical decision making (see chart for details).   Viral illness.  Patient has history of asthma.  Lungs are clear, good air movement, O2 sat 98% on room air.  Instructed patient to continue using albuterol inhaler as needed.  COVID and Flu pending.  Instructed patient to self quarantine per CDC guidelines.  Discussed symptomatic treatment including Tylenol or ibuprofen, rest, hydration.  Instructed patient to follow up with PCP if symptoms are not improving.  Patient agrees to plan of care.    Final Clinical Impressions(s) / UC Diagnoses   Final diagnoses:  Viral illness     Discharge Instructions      Your COVID and Flu tests are pending.  You should self quarantine until the test results are back.    Take Tylenol or ibuprofen as needed for fever  or discomfort.  Rest and keep yourself hydrated.    Follow-up with your primary care provider if your symptoms are not improving.         ED  Prescriptions   None    PDMP not reviewed this encounter.   Mickie Bail, NP 03/06/21 1148

## 2021-03-06 NOTE — Discharge Instructions (Addendum)
Your COVID and Flu tests are pending.  You should self quarantine until the test results are back.    Take Tylenol or ibuprofen as needed for fever or discomfort.  Rest and keep yourself hydrated.    Follow-up with your primary care provider if your symptoms are not improving.     

## 2021-03-06 NOTE — ED Triage Notes (Signed)
Pt c/o cough, fever, bodyaches, HA, and ST. At home covid test was negative.

## 2021-03-07 LAB — COVID-19, FLU A+B NAA
Influenza A, NAA: DETECTED — AB
Influenza B, NAA: NOT DETECTED
SARS-CoV-2, NAA: NOT DETECTED

## 2021-04-03 DIAGNOSIS — F4323 Adjustment disorder with mixed anxiety and depressed mood: Secondary | ICD-10-CM | POA: Diagnosis not present

## 2021-04-17 ENCOUNTER — Telehealth: Payer: Self-pay

## 2021-04-17 NOTE — Telephone Encounter (Signed)
Medication: lisdexamfetamine (VYVANSE) 60 MG capsule Prior authorization submitted via CoverMyMeds on 04/17/2021 PA submission pending

## 2021-04-17 NOTE — Telephone Encounter (Signed)
Medication: lisdexamfetamine (VYVANSE) 60 MG capsule (Expired) Prior authorization determination received Medication has been approved Approval dates: 03/18/2021-04/17/2022  Patient aware via: [place] aware: Yes Provider aware via this encounter

## 2021-04-24 ENCOUNTER — Other Ambulatory Visit: Payer: Self-pay

## 2021-04-24 DIAGNOSIS — F988 Other specified behavioral and emotional disorders with onset usually occurring in childhood and adolescence: Secondary | ICD-10-CM

## 2021-04-24 MED ORDER — LISDEXAMFETAMINE DIMESYLATE 60 MG PO CAPS: 60.0000 mg | ORAL_CAPSULE | ORAL | 0 refills | Status: DC

## 2021-06-04 ENCOUNTER — Other Ambulatory Visit: Payer: Self-pay

## 2021-06-04 DIAGNOSIS — F988 Other specified behavioral and emotional disorders with onset usually occurring in childhood and adolescence: Secondary | ICD-10-CM

## 2021-06-04 MED ORDER — LISDEXAMFETAMINE DIMESYLATE 60 MG PO CAPS: 60.0000 mg | ORAL_CAPSULE | ORAL | 0 refills | Status: DC

## 2021-06-04 NOTE — Telephone Encounter (Signed)
She has a visit with me on 3/27, 3/30 and a visit to establish care with another provider on 3/30.  What is her plan?

## 2021-06-04 NOTE — Telephone Encounter (Signed)
Hannah Martin has been scheduled for March 27 th. She is requesting a refill on Vyvanse.  ?

## 2021-06-04 NOTE — Telephone Encounter (Signed)
I spoke with Hannah Martin and she is switching care to a provider that is closer. I have cancelled all appointments with Dr Ashley Royalty. She would appreciate a refill to last her until this appointment.  ?

## 2021-06-04 NOTE — Telephone Encounter (Signed)
30 days sent.  I wish her the best.  ? ?Thanks! ? ?CM

## 2021-06-05 DIAGNOSIS — F4323 Adjustment disorder with mixed anxiety and depressed mood: Secondary | ICD-10-CM | POA: Diagnosis not present

## 2021-06-23 ENCOUNTER — Ambulatory Visit: Payer: BC Managed Care – PPO | Admitting: Family Medicine

## 2021-06-26 ENCOUNTER — Ambulatory Visit: Payer: BC Managed Care – PPO | Admitting: Nurse Practitioner

## 2021-06-26 ENCOUNTER — Telehealth: Payer: Self-pay

## 2021-06-26 ENCOUNTER — Ambulatory Visit: Payer: BC Managed Care – PPO | Admitting: Family Medicine

## 2021-06-26 NOTE — Telephone Encounter (Signed)
[facility] Night - Client ?Nonclinical Telephone Record  ?AccessNurse? ?Client [facility] Night - Client ?Client Site [facility] - Night ?Provider AA - PHYSICIAN, UNKNOWN- MD ?Contact Type Call ?Who Is Calling Patient / Member / Family / Caregiver ?Caller Name Kamylle Axelson ?Caller Phone Number [phone] ?Patient Name Hannah Martin ?Patient DOB [birth year] ?Call Type Message Only Information Provided ?Reason for Call Request to Schedule Office Appointment ?Initial Comment Caller states she is needing to schedule an appointment. General info hours provided ?Patient request to speak to RN No ?Disp. Time Disposition Final User ?06/26/2021 8:10:26 AM General Information Provided Yes Bryon Lions, [place] ?Call Closed By: Richrd Humbles ?Transaction Date/Time: 06/26/2021 8:08:17 AM (ET ?

## 2021-06-26 NOTE — Telephone Encounter (Signed)
Pt has a new pt appt on 03/30 at 2 PM to establish care that was scheduled via my chart  Amy front office mgr has already changed new pt appt to 06/30/21 at 10 AM for Audria Nine NP. ?

## 2021-06-30 ENCOUNTER — Encounter: Payer: Self-pay | Admitting: Nurse Practitioner

## 2021-06-30 ENCOUNTER — Ambulatory Visit (INDEPENDENT_AMBULATORY_CARE_PROVIDER_SITE_OTHER): Payer: BC Managed Care – PPO | Admitting: Nurse Practitioner

## 2021-06-30 VITALS — BP 132/80 | HR 87 | Temp 97.8°F | Resp 10 | Ht 66.0 in | Wt 170.3 lb

## 2021-06-30 DIAGNOSIS — J4531 Mild persistent asthma with (acute) exacerbation: Secondary | ICD-10-CM

## 2021-06-30 DIAGNOSIS — Z0000 Encounter for general adult medical examination without abnormal findings: Secondary | ICD-10-CM

## 2021-06-30 DIAGNOSIS — Z79899 Other long term (current) drug therapy: Secondary | ICD-10-CM | POA: Diagnosis not present

## 2021-06-30 DIAGNOSIS — F988 Other specified behavioral and emotional disorders with onset usually occurring in childhood and adolescence: Secondary | ICD-10-CM | POA: Diagnosis not present

## 2021-06-30 LAB — CBC WITH DIFFERENTIAL/PLATELET
Basophils Absolute: 0 10*3/uL (ref 0.0–0.1)
Basophils Relative: 1 % (ref 0.0–3.0)
Eosinophils Absolute: 0 10*3/uL (ref 0.0–0.7)
Eosinophils Relative: 0.7 % (ref 0.0–5.0)
HCT: 39.5 % (ref 36.0–46.0)
Hemoglobin: 13 g/dL (ref 12.0–15.0)
Lymphocytes Relative: 37.6 % (ref 12.0–46.0)
Lymphs Abs: 1.6 10*3/uL (ref 0.7–4.0)
MCHC: 33 g/dL (ref 30.0–36.0)
MCV: 87.6 fl (ref 78.0–100.0)
Monocytes Absolute: 0.2 10*3/uL (ref 0.1–1.0)
Monocytes Relative: 5.2 % (ref 3.0–12.0)
Neutro Abs: 2.3 10*3/uL (ref 1.4–7.7)
Neutrophils Relative %: 55.5 % (ref 43.0–77.0)
Platelets: 244 10*3/uL (ref 150.0–400.0)
RBC: 4.51 Mil/uL (ref 3.87–5.11)
RDW: 13 % (ref 11.5–15.5)
WBC: 4.1 10*3/uL (ref 4.0–10.5)

## 2021-06-30 LAB — COMPREHENSIVE METABOLIC PANEL
ALT: 11 U/L (ref 0–35)
AST: 16 U/L (ref 0–37)
Albumin: 4.5 g/dL (ref 3.5–5.2)
Alkaline Phosphatase: 46 U/L (ref 39–117)
BUN: 16 mg/dL (ref 6–23)
CO2: 23 mEq/L (ref 19–32)
Calcium: 9.5 mg/dL (ref 8.4–10.5)
Chloride: 108 mEq/L (ref 96–112)
Creatinine, Ser: 0.91 mg/dL (ref 0.40–1.20)
GFR: 82.76 mL/min (ref >60.00)
Glucose, Bld: 85 mg/dL (ref 70–99)
Potassium: 4 mEq/L (ref 3.5–5.1)
Sodium: 139 mEq/L (ref 135–145)
Total Bilirubin: 0.5 mg/dL (ref 0.2–1.2)
Total Protein: 6.9 g/dL (ref 6.0–8.3)

## 2021-06-30 LAB — TSH: TSH: 0.68 u[IU]/mL (ref 0.35–5.50)

## 2021-06-30 LAB — LIPID PANEL
Cholesterol: 195 mg/dL (ref 0–200)
HDL: 75.7 mg/dL (ref >39.00)
LDL Cholesterol: 109 mg/dL — ABNORMAL HIGH (ref 0–99)
NonHDL: 118.99
Total CHOL/HDL Ratio: 3
Triglycerides: 49 mg/dL (ref 0.0–149.0)
VLDL: 9.8 mg/dL (ref 0.0–40.0)

## 2021-06-30 MED ORDER — LISDEXAMFETAMINE DIMESYLATE 60 MG PO CAPS: 60.0000 mg | ORAL_CAPSULE | ORAL | 0 refills | Status: DC

## 2021-06-30 NOTE — Patient Instructions (Signed)
Nice to see you today ?I will be in touch with the lab results ?Follow up with me in 6 months, sooner if you need me ?

## 2021-06-30 NOTE — Progress Notes (Signed)
? ?Established Patient Office Visit ? ?Subjective:  ?Patient ID: Hannah Martin, female    DOB: 1985-12-27  Age: 36 y.o. MRN: [id number] ? ?CC:  ?Chief Complaint  ?Patient presents with  ? Establish Care  ?  Previous PCP Dr Sherryl Barters in [place].  ? Medication Refill  ? ? ?HPI ?Hannah Martin presents for  ? ?Kyleena : 2020 or 2021 havent been back to GYN since placement ? ?Asthma: uses her albuterol inhaler approx once a month currently ? ?ADD: has been on vyvanse 60mg  and does well. Seems to get adequate sleep and denies palpitations  ? ?for complete physical and follow up of chronic conditions. ? ?Immunizations: ?-Tetanus:2019 ?-Influenza: refused ?-Covid-19: moderna x2 ?-Shingles: NA ?-Pneumonia: NA ? ?-HPV: UTD per patient report ? ?Diet: Fair diet. 2-3 meals a day. Does snack through.coffe and water. Energy drinks intermittently ?Exercise:  Daily 30 mins. Yoga ? ?Eye exam: Completes annually Contacts sees yearly ?Dental exam:  Needs updated ? ?Pap Smear: Completed in 06/13/2020, normal not currently followed by GYN ?Mammogram:  mother has breast CA  dx at 24 years old. Baseline at 37 likely ?Colonoscopy: too young ?Dexa:  NA ? ?Lung Cancer Screening: NA   ? ?Sleep: States bed around 1030 and get up around 5am. Does not snore. Feels rested ? ?Past Medical History:  ?Diagnosis Date  ? ADHD   ? Asthma   ? H/O: 1 miscarriage   ? ? ?Past Surgical History:  ?Procedure Laterality Date  ? DILATION AND CURETTAGE OF UTERUS    ? INDUCED ABORTION    ? x 2.  ? TUMOR REMOVAL    ? 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 ? ?Family History  ?Problem Relation Age of Onset  ? Diabetes Mother   ? Anemia Mother   ? Breast cancer Mother   ?     79  ? ADD / ADHD Sister   ? Heart failure Maternal Grandmother   ? ? ?Social History  ? ?Socioeconomic History  ? Marital status: Single  ?  Spouse name: Not on file  ? Number of children: Not on file  ? Years of education: Not on file  ? Highest education level: Not on file   ?Occupational History  ? Not on file  ?Tobacco Use  ? Smoking status: Never  ? Smokeless tobacco: Never  ?Vaping Use  ? Vaping Use: Former  ? Quit date: 03/30/2018  ? Devices: vaped for about 1 1/2 years  ?Substance and Sexual Activity  ? Alcohol use: Yes  ?  Comment: 3 per week   ? Drug use: Yes  ?  Types: Marijuana  ?  Comment: daily  ? Sexual activity: Yes  ?  Birth control/protection: I.U.D.  ?  Comment: straight   ?Other Topics Concern  ? Not on file  ?Social History Narrative  ? Fulltime: 05/29/2018  ?   ? Hobbies: Read, travel  ? ?Social Determinants of Health  ? ?Financial Resource Strain: Not on file  ?Food Insecurity: Not on file  ?Transportation Needs: Not on file  ?Physical Activity: Not on file  ?Stress: Not on file  ?Social Connections: Not on file  ?Intimate Partner Violence: Not on file  ? ? ?Outpatient Medications Prior to Visit  ?Medication Sig Dispense Refill  ? albuterol (VENTOLIN HFA) 108 (90 Base) MCG/ACT inhaler INHALE 1 TO 2 PUFFS INTO LUNGS EVERY 6 HOURS AS NEEDED FOR WHEEZING OR SHORTNESS OF BREATH 18 g 0  ? levonorgestrel (  KYLEENA) 19.5 MG IUD 1 each by Intrauterine route once.    ? lisdexamfetamine (VYVANSE) 60 MG capsule Take 1 capsule (60 mg total) by mouth every morning. Fill now 30 capsule 0  ? lisdexamfetamine (VYVANSE) 60 MG capsule Take 1 capsule (60 mg total) by mouth every morning. Fill in 60 days 30 capsule 0  ? lisdexamfetamine (VYVANSE) 60 MG capsule Take 1 capsule (60 mg total) by mouth every morning. Fill in 30 days 30 capsule 0  ? ?No facility-administered medications prior to visit.  ? ? ?No Known Allergies ? ?ROS ?Review of Systems  ?Constitutional:  Negative for chills, fatigue and fever.  ?Respiratory:  Negative for cough and shortness of breath.   ?Cardiovascular:  Negative for chest pain, palpitations and leg swelling.  ?Gastrointestinal:  Negative for diarrhea, nausea and vomiting.  ?     BM daily ?  ?Genitourinary:  Negative for difficulty urinating, dysuria,  hematuria, vaginal bleeding, vaginal discharge and vaginal pain.  ?Neurological:  Negative for dizziness, weakness, light-headedness, numbness and headaches.  ?Psychiatric/Behavioral:  Negative for hallucinations and suicidal ideas.   ? ?  ?Objective:  ?  ?Physical Exam ?Vitals and nursing note reviewed.  ?Constitutional:   ?   Appearance: Normal appearance.  ?HENT:  ?   Right Ear: Tympanic membrane, ear canal and external ear normal.  ?   Left Ear: Tympanic membrane, ear canal and external ear normal.  ?   Mouth/Throat:  ?   Mouth: Mucous membranes are moist.  ?   Pharynx: Oropharynx is clear.  ?Eyes:  ?   Extraocular Movements: Extraocular movements intact.  ?   Pupils: Pupils are equal, round, and reactive to light.  ?   Comments: Wears corrective lenses, mostly contacts  ?Neck:  ?   Thyroid: No thyroid mass, thyromegaly or thyroid tenderness.  ?Cardiovascular:  ?   Rate and Rhythm: Normal rate and regular rhythm.  ?   Heart sounds: Normal heart sounds.  ?Pulmonary:  ?   Effort: Pulmonary effort is normal.  ?   Breath sounds: Normal breath sounds.  ?Abdominal:  ?   General: Bowel sounds are normal. There is no distension.  ?   Palpations: There is no mass.  ?   Tenderness: There is no abdominal tenderness.  ?   Hernia: No hernia is present.  ?Musculoskeletal:  ?   Right lower leg: No edema.  ?   Left lower leg: No edema.  ?Lymphadenopathy:  ?   Cervical: No cervical adenopathy.  ?Skin: ?   General: Skin is warm.  ?Neurological:  ?   General: No focal deficit present.  ?   Mental Status: She is alert.  ?   Deep Tendon Reflexes:  ?   Reflex Scores: ?     Bicep reflexes are 2+ on the right side and 2+ on the left side. ?     Patellar reflexes are 2+ on the right side and 2+ on the left side. ?   Comments: Bilateral upper and lower extremity strength 5/5  ?Psychiatric:     ?   Mood and Affect: Mood normal.     ?   Behavior: Behavior normal.     ?   Thought Content: Thought content normal.     ?   Judgment: Judgment  normal.  ? ? ?BP 132/80   Pulse 87   Temp 97.8 ?F (36.6 ?C)   Resp 10   Ht 5\' 6"  (1.676 m)   Wt 170 lb 5  oz (77.3 kg)   LMP 06/17/2021 Comment: does have IUD  SpO2 97%   BMI 27.49 kg/m?  ?Wt Readings from Last 3 Encounters:  ?06/30/21 170 lb 5 oz (77.3 kg)  ?12/26/20 175 lb (79.4 kg)  ?06/13/20 167 lb 1.3 oz (75.8 kg)  ? ? ? ?Health Maintenance Due  ?Topic Date Due  ? Hepatitis C Screening  Never done  ? COVID-19 Vaccine (3 - Booster for Pfizer series) 10/29/2020  ? ? ?There are no preventive care reminders to display for this patient. ? ?Lab Results  ?Component Value Date  ? TSH 1.23 01/21/2017  ? ?Lab Results  ?Component Value Date  ? WBC 3.8 06/13/2020  ? HGB 12.5 06/13/2020  ? HCT 37.8 06/13/2020  ? MCV 86.5 06/13/2020  ? PLT 306 06/13/2020  ? ?Lab Results  ?Component Value Date  ? NA 138 06/13/2020  ? K 5.0 06/13/2020  ? CO2 22 06/13/2020  ? GLUCOSE 68 06/13/2020  ? BUN 18 06/13/2020  ? CREATININE 0.83 06/13/2020  ? BILITOT 1.4 (H) 06/13/2020  ? ALKPHOS 46 11/12/2015  ? AST 13 06/13/2020  ? ALT 11 06/13/2020  ? PROT 6.8 06/13/2020  ? ALBUMIN 4.3 11/12/2015  ? CALCIUM 9.7 06/13/2020  ? ?Lab Results  ?Component Value Date  ? CHOL 198 01/06/2018  ? ?Lab Results  ?Component Value Date  ? HDL 60 01/06/2018  ? ?Lab Results  ?Component Value Date  ? LDLCALC 122 (H) 01/06/2018  ? ?Lab Results  ?Component Value Date  ? TRIG 70 01/06/2018  ? ?Lab Results  ?Component Value Date  ? CHOLHDL 3.3 01/06/2018  ? ?No results found for: HGBA1C ? ?  ?Assessment & Plan:  ? ?Problem List Items Addressed This Visit   ? ?  ? Respiratory  ? Mild persistent asthma with acute exacerbation  ?  Patient currently maintained on albuterol inhaler as needed.  States that she uses inhaler maybe once a month.  Continue using medication as prescribed ?  ?  ?  ? Other  ? Preventative health care - Primary  ?  Discussed age-appropriate immunizations and screening exams.  Offered to do a breast exam with patient she politely declined.  Last  Pap smear was 1 year ago that was normal. ?  ?  ? Relevant Orders  ? CBC with Differential/Platelet  ? Comprehensive metabolic panel  ? Lipid panel  ? TSH  ? Attention deficit disorder (ADD) in adult  ?  Currently

## 2021-06-30 NOTE — Assessment & Plan Note (Signed)
Discussed age-appropriate immunizations and screening exams.  Offered to do a breast exam with patient she politely declined.  Last Pap smear was 1 year ago that was normal. ?

## 2021-06-30 NOTE — Assessment & Plan Note (Signed)
Currently maintained on Vyvanse 60 mg daily.  Patient states medication works appropriately and not having any adverse drug events.  Did discuss with patient's that she does smoke marijuana recreationally that she will need to stop using marijuana to continue getting controlled medication from me.  She acknowledged we did discuss about a 61-month timeline for her to work on weaning and cutting out marijuana use.  Controlled substance agreement signed today along with urine drug screen ordered.  Continue Vyvanse 60 mg currently ?

## 2021-06-30 NOTE — Assessment & Plan Note (Signed)
Patient currently maintained on albuterol inhaler as needed.  States that she uses inhaler maybe once a month.  Continue using medication as prescribed ?

## 2021-07-03 LAB — DRUG MONITORING, PANEL 8 WITH CONFIRMATION, URINE
6 Acetylmorphine: NEGATIVE ng/mL (ref <10)
Alcohol Metabolites: NEGATIVE ng/mL (ref <500)
Amphetamine: 2717 ng/mL — ABNORMAL HIGH (ref <250)
Amphetamines: POSITIVE ng/mL — AB (ref <500)
Benzodiazepines: NEGATIVE ng/mL (ref <100)
Buprenorphine, Urine: NEGATIVE ng/mL (ref <5)
Cocaine Metabolite: NEGATIVE ng/mL (ref <150)
Creatinine: 116.7 mg/dL (ref >=20.0)
MDMA: NEGATIVE ng/mL (ref <500)
Marijuana Metabolite: 318 ng/mL — ABNORMAL HIGH (ref <5)
Marijuana Metabolite: POSITIVE ng/mL — AB (ref <20)
Methamphetamine: NEGATIVE ng/mL (ref <250)
Opiates: NEGATIVE ng/mL (ref <100)
Oxidant: NEGATIVE ug/mL (ref <200)
Oxycodone: NEGATIVE ng/mL (ref <100)
pH: 7.6 (ref 4.5–9.0)

## 2021-07-03 LAB — DM TEMPLATE

## 2021-07-17 DIAGNOSIS — F4323 Adjustment disorder with mixed anxiety and depressed mood: Secondary | ICD-10-CM | POA: Diagnosis not present

## 2021-11-06 DIAGNOSIS — F4323 Adjustment disorder with mixed anxiety and depressed mood: Secondary | ICD-10-CM | POA: Diagnosis not present

## 2021-11-11 ENCOUNTER — Other Ambulatory Visit: Payer: Self-pay | Admitting: Nurse Practitioner

## 2021-11-11 DIAGNOSIS — F988 Other specified behavioral and emotional disorders with onset usually occurring in childhood and adolescence: Secondary | ICD-10-CM

## 2021-11-11 NOTE — Telephone Encounter (Signed)
  Encourage patient to contact the pharmacy for refills or they can request refills through [facility]  Did the patient contact the pharmacy:  yes   LAST APPOINTMENT DATE:  Please schedule appointment if longer than 1 year  NEXT APPOINTMENT DATE:01/01/2022  MEDICATION: lisdexamfetamine  Is the patient out of medication? Yes   If not, how much is left?  Is this a 90 day supply: yes  [facility] [address] [facility] [place] [facility]  Let patient know to contact pharmacy at the end of the day to make sure medication is ready.  Please notify patient to allow 48-72 hours to process

## 2021-11-11 NOTE — Telephone Encounter (Signed)
Name of Medication: Vyvanse Name of Pharmacy: [facility] is out of stock Last Fill or Written Date and Quantity: filled x 3 on 06/30/21 Last Office Visit and Type: 06/30/21 CPE Next Office Visit and Type: 01/01/22 f/u Last Controlled Substance Agreement Date: 06/30/21 Last UDS: 06/30/21  I only pulled down 1 RX for refill, not sure if you want to send 2 to last until her next appointment.

## 2021-11-13 ENCOUNTER — Other Ambulatory Visit: Payer: Self-pay | Admitting: Nurse Practitioner

## 2021-11-13 DIAGNOSIS — F988 Other specified behavioral and emotional disorders with onset usually occurring in childhood and adolescence: Secondary | ICD-10-CM

## 2021-11-13 MED ORDER — LISDEXAMFETAMINE DIMESYLATE 60 MG PO CAPS: 60.0000 mg | ORAL_CAPSULE | ORAL | 0 refills | Status: DC

## 2021-11-20 DIAGNOSIS — F4323 Adjustment disorder with mixed anxiety and depressed mood: Secondary | ICD-10-CM | POA: Diagnosis not present

## 2021-12-11 ENCOUNTER — Other Ambulatory Visit: Payer: Self-pay | Admitting: Nurse Practitioner

## 2021-12-11 ENCOUNTER — Telehealth: Payer: Self-pay | Admitting: Nurse Practitioner

## 2021-12-11 DIAGNOSIS — F988 Other specified behavioral and emotional disorders with onset usually occurring in childhood and adolescence: Secondary | ICD-10-CM

## 2021-12-11 DIAGNOSIS — F4323 Adjustment disorder with mixed anxiety and depressed mood: Secondary | ICD-10-CM | POA: Diagnosis not present

## 2021-12-11 MED ORDER — LISDEXAMFETAMINE DIMESYLATE 60 MG PO CAPS: 60.0000 mg | ORAL_CAPSULE | ORAL | 0 refills | Status: DC

## 2021-12-11 NOTE — Telephone Encounter (Signed)
  Encourage patient to contact the pharmacy for refills or they can request refills through [facility]  Did the patient contact the pharmacy: Yes  LAST APPOINTMENT DATE: 06/30/2021  NEXT APPOINTMENT DATE: 01/01/2022  MEDICATION: lisdexamfetamine (VYVANSE) 60 MG capsule  Is the patient out of medication? Yes  PHARMACY: [id number] - [place], [place] - [address] [place] [address]  Let patient know to contact pharmacy at the end of the day to make sure medication is ready.  Please notify patient to allow 48-72 hours to process

## 2021-12-11 NOTE — Telephone Encounter (Signed)
Left message to call back Vyvanse x 3 RX was sent in to [facility] on 11/13/21 that pharmacy has 2 refills on file still. [facility] [place], is this because [address] is out of stock?

## 2021-12-11 NOTE — Telephone Encounter (Signed)
Patient called back in and informed of message below. Patient stated [facility] is out of stock of that one and generic one. [place] had it in stock yesterday.

## 2021-12-11 NOTE — Telephone Encounter (Signed)
Medication called into [place] [facility]. The vyvanse script for 12/11/2021 was canceled at the [facility]

## 2022-01-01 ENCOUNTER — Ambulatory Visit: Payer: BC Managed Care – PPO | Admitting: Nurse Practitioner

## 2022-01-14 ENCOUNTER — Ambulatory Visit: Payer: BC Managed Care – PPO | Admitting: Family Medicine

## 2022-01-14 ENCOUNTER — Telehealth (INDEPENDENT_AMBULATORY_CARE_PROVIDER_SITE_OTHER): Payer: BC Managed Care – PPO | Admitting: Family Medicine

## 2022-01-14 ENCOUNTER — Encounter: Payer: Self-pay | Admitting: Family Medicine

## 2022-01-14 DIAGNOSIS — F988 Other specified behavioral and emotional disorders with onset usually occurring in childhood and adolescence: Secondary | ICD-10-CM | POA: Diagnosis not present

## 2022-01-14 MED ORDER — LISDEXAMFETAMINE DIMESYLATE 60 MG PO CAPS: 60.0000 mg | ORAL_CAPSULE | ORAL | 0 refills | Status: DC

## 2022-01-14 NOTE — Progress Notes (Signed)
Hannah Martin - 36 y.o. female MRN [id number]  Date of birth: 07-04-1985   This visit type was conducted due to national recommendations for restrictions regarding the COVID-19 Pandemic (e.g. social distancing).  This format is felt to be most appropriate for this patient at this time.  All issues noted in this document were discussed and addressed.  No physical exam was performed (except for noted visual exam findings with Video Visits).  I discussed the limitations of evaluation and management by telemedicine and the availability of in person appointments. The patient expressed understanding and agreed to proceed.  I connected withNAME@ on 01/14/22 at  1:10 PM EDT by a video enabled telemedicine application and verified that I am speaking with the correct person using two identifiers.  Present at visit: Hannah Coombe, DO Hannah Martin   Patient Location: home [address] [place] [address]   Provider location:   [facility]  Chief Complaint  Patient presents with   Establish Care    Pt would like to get refills on Vyvanse    HPI  Hannah Martin is a 36 y.o. female who presents via audio/video conferencing for a telehealth visit today.  Following up today for ADHD.  She did establish with a new provider and [facility] over the past year however got a new job locally and would like to reestablish with our practice.  ADHD has been managed with Vyvanse 60 mg daily.  She has done quite well with this.  She denies side effects from current medication including insomnia, significant appetite suppression or increased anxiety.   ROS:  A comprehensive ROS was completed and negative except as noted per HPI  Past Medical History:  Diagnosis Date   ADHD    Asthma    H/O: 1 miscarriage     Past Surgical History:  Procedure Laterality Date   DILATION AND CURETTAGE OF UTERUS     INDUCED ABORTION     x 2.   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    Family  History  Problem Relation Age of Onset   Diabetes Mother    Anemia Mother    Breast cancer Mother        15   ADD / ADHD Sister    Heart failure Maternal Grandmother     Social History   Socioeconomic History   Marital status: Single    Spouse name: Not on file   Number of children: Not on file   Years of education: Not on file   Highest education level: Not on file  Occupational History   Not on file  Tobacco Use   Smoking status: Never   Smokeless tobacco: Never  Vaping Use   Vaping Use: Former   Quit date: 03/30/2018   Devices: vaped for about 1 1/2 years  Substance and Sexual Activity   Alcohol use: Yes    Comment: 3 per week    Drug use: Yes    Types: Marijuana    Comment: daily   Sexual activity: Yes    Birth control/protection: I.U.D.    Comment: straight   Other Topics Concern   Not on file  Social History Narrative   Fulltime: Occupational hygienist      Hobbies: Read, travel   Social Determinants of Health   Financial Resource Strain: Not on file  Food Insecurity: Not on file  Transportation Needs: Not on file  Physical Activity: Not on file  Stress:  Not on file  Social Connections: Not on file  Intimate Partner Violence: Not on file     Current Outpatient Medications:    albuterol (VENTOLIN HFA) 108 (90 Base) MCG/ACT inhaler, INHALE 1 TO 2 PUFFS INTO LUNGS EVERY 6 HOURS AS NEEDED FOR WHEEZING OR SHORTNESS OF BREATH, Disp: 18 g, Rfl: 0   levonorgestrel (KYLEENA) 19.5 MG IUD, 1 each by Intrauterine route once., Disp: , Rfl:    lisdexamfetamine (VYVANSE) 60 MG capsule, Take 1 capsule (60 mg total) by mouth every morning. Fill in 60 days, Disp: 30 capsule, Rfl: 0   lisdexamfetamine (VYVANSE) 60 MG capsule, Take 1 capsule (60 mg total) by mouth every morning., Disp: 30 capsule, Rfl: 0   lisdexamfetamine (VYVANSE) 60 MG capsule, Take 1 capsule (60 mg total) by mouth every morning. Fill in 30 days, Disp: 30 capsule, Rfl: 0  EXAM:  VITALS per patient if  applicable: Wt 167 lb (75.8 kg)   LMP 12/21/2021 (Exact Date)   BMI 26.95 kg/m   GENERAL: alert, oriented, appears well and in no acute distress  HEENT: atraumatic, conjunttiva clear, no obvious abnormalities on inspection of external nose and ears  NECK: normal movements of the head and neck  LUNGS: on inspection no signs of respiratory distress, breathing rate appears normal, no obvious gross SOB, gasping or wheezing  CV: no obvious cyanosis  MS: moves all visible extremities without noticeable abnormality  PSYCH/NEURO: pleasant and cooperative, no obvious depression or anxiety, speech and thought processing grossly intact  ASSESSMENT AND PLAN:  Discussed the following assessment and plan:  Attention deficit disorder (ADD) in adult Patient was given ultimatum to discontinue marijuana use with last visit with previous PCP.  I will provide her with a 70-month supply however she will need to be  seen in person at next visit in 3 months.  We will discuss getting updated controlled substance agreement and UDS at that time.     I discussed the assessment and treatment plan with the patient. The patient was provided an opportunity to ask questions and all were answered. The patient agreed with the plan and demonstrated an understanding of the instructions.   The patient was advised to call back or seek an in-person evaluation if the symptoms worsen or if the condition fails to improve as anticipated.    Luetta Nutting, DO

## 2022-01-14 NOTE — Assessment & Plan Note (Signed)
Patient was given ultimatum to discontinue marijuana use with last visit with previous PCP.  I will provide her with a 20-month supply however she will need to be  seen in person at next visit in 3 months.  We will discuss getting updated controlled substance agreement and UDS at that time.

## 2022-01-15 DIAGNOSIS — F4323 Adjustment disorder with mixed anxiety and depressed mood: Secondary | ICD-10-CM | POA: Diagnosis not present

## 2022-01-29 DIAGNOSIS — F4323 Adjustment disorder with mixed anxiety and depressed mood: Secondary | ICD-10-CM | POA: Diagnosis not present

## 2022-03-05 DIAGNOSIS — F4323 Adjustment disorder with mixed anxiety and depressed mood: Secondary | ICD-10-CM | POA: Diagnosis not present

## 2022-04-16 DIAGNOSIS — F4323 Adjustment disorder with mixed anxiety and depressed mood: Secondary | ICD-10-CM | POA: Diagnosis not present

## 2022-04-21 ENCOUNTER — Telehealth: Payer: Self-pay

## 2022-04-21 NOTE — Telephone Encounter (Addendum)
Initiated Prior authorization VFI:EPPIRJJ 60MG  capsules Via: Covermymeds Case/Key:[id number] Status: approved  as of 04/21/22 Reason:Coverage Start Date:03/22/2022;Coverage End Date:04/21/2023; Notified Pt via: Mychart

## 2022-05-05 ENCOUNTER — Ambulatory Visit (INDEPENDENT_AMBULATORY_CARE_PROVIDER_SITE_OTHER): Payer: BC Managed Care – PPO | Admitting: Family Medicine

## 2022-05-05 ENCOUNTER — Encounter: Payer: Self-pay | Admitting: Family Medicine

## 2022-05-05 VITALS — BP 111/70 | HR 71 | Ht 66.0 in | Wt 171.0 lb

## 2022-05-05 DIAGNOSIS — F988 Other specified behavioral and emotional disorders with onset usually occurring in childhood and adolescence: Secondary | ICD-10-CM | POA: Diagnosis not present

## 2022-05-05 MED ORDER — LISDEXAMFETAMINE DIMESYLATE 60 MG PO CAPS: 60.0000 mg | ORAL_CAPSULE | ORAL | 0 refills | Status: DC

## 2022-05-05 NOTE — Progress Notes (Signed)
Hannah Martin - 37 y.o. female MRN [id number]  Date of birth: 1985/09/11  Subjective Chief Complaint  Patient presents with   Medication Refill    HPI Hannah Martin is a 37 y.o. female here today for follow up visit.   She continues on vyvanse 60mg  daily.  She reports that she is doing well with this at current strength.  She has not had any significant side effects from medication.  Occasionally she has to use melatonin to help with sleep.  Rare marijuana use.   ROS:  A comprehensive ROS was completed and negative except as noted per HPI  No Known Allergies  Past Medical History:  Diagnosis Date   ADHD    Asthma    H/O: 1 miscarriage     Past Surgical History:  Procedure Laterality Date   DILATION AND CURETTAGE OF UTERUS     INDUCED ABORTION     x 2.   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    Comment: straight   Other Topics Concern   Not on file  Social History Narrative   Fulltime: Scientist, research (medical)      Hobbies: Read, travel   Social Determinants of Health   Financial Resource Strain: Not on file  Food Insecurity: Not on file  Transportation Needs: Not on file  Physical Activity: Not on file  Stress: Not on file  Social Connections: Not on file    Family History  Problem Relation Age of Onset   Diabetes Mother    Anemia Mother     Breast cancer Mother        45   ADD / ADHD Sister    Heart failure Maternal Grandmother     Health Maintenance  Topic Date Due   Hepatitis C Screening  Never done   INFLUENZA VACCINE  06/28/2022 (Originally 10/28/2021)   COVID-19 Vaccine (3 - Pfizer risk series) 05/22/2023 (Originally 10/01/2020)   PAP SMEAR-Modifier  06/14/2023   DTaP/Tdap/Td (2 - Td or Tdap) 01/07/2028   HIV Screening  Completed   HPV VACCINES  Aged Out     ----------------------------------------------------------------------------------------------------------------------------------------------------------------------------------------------------------------- Physical Exam BP 111/70 (BP Location: Left Arm, Patient Position: Sitting, Cuff Size: Normal)   Pulse 71   Ht 5\' 6"  (1.676 m)   Wt 171 lb (77.6 kg)   SpO2 100%   BMI 27.60 kg/m   Physical Exam Constitutional:      Appearance: Normal appearance.  Eyes:     General: No scleral icterus. Cardiovascular:     Rate and Rhythm: Normal rate and regular rhythm.  Pulmonary:     Effort: Pulmonary effort is normal.     Breath sounds: Normal breath sounds.  Musculoskeletal:     Cervical back: Neck supple.  Neurological:     Mental Status: She is alert.  Psychiatric:        Mood and Affect: Mood normal.        Behavior: Behavior normal.     ------------------------------------------------------------------------------------------------------------------------------------------------------------------------------------------------------------------- Assessment and Plan  Attention deficit disorder (ADD) in adult She is doing well with vyvanse at current strength.  Will plan to continue.  Rare marijuana use, no signs of dependence.  Return in about 6 months (around 11/03/2022) for ADHD.   No orders of the defined types were placed in this encounter.   Return in about 6 months (around 11/03/2022) for ADHD.    This visit occurred during the SARS-CoV-2  public health emergency.  Safety protocols were in place, including screening questions prior to the visit, additional usage of staff PPE, and extensive cleaning of exam room while observing appropriate contact time as indicated for disinfecting solutions.

## 2022-05-05 NOTE — Assessment & Plan Note (Signed)
She is doing well with vyvanse at current strength.  Will plan to continue.  Rare marijuana use, no signs of dependence.  Return in about 6 months (around 11/03/2022) for ADHD.

## 2022-05-12 DIAGNOSIS — Z124 Encounter for screening for malignant neoplasm of cervix: Secondary | ICD-10-CM | POA: Diagnosis not present

## 2022-05-12 DIAGNOSIS — Z01419 Encounter for gynecological examination (general) (routine) without abnormal findings: Secondary | ICD-10-CM | POA: Diagnosis not present

## 2022-05-13 DIAGNOSIS — Z1321 Encounter for screening for nutritional disorder: Secondary | ICD-10-CM | POA: Diagnosis not present

## 2022-05-13 DIAGNOSIS — E559 Vitamin D deficiency, unspecified: Secondary | ICD-10-CM | POA: Diagnosis not present

## 2022-05-13 DIAGNOSIS — Z13220 Encounter for screening for lipoid disorders: Secondary | ICD-10-CM | POA: Diagnosis not present

## 2022-05-13 DIAGNOSIS — Z13228 Encounter for screening for other metabolic disorders: Secondary | ICD-10-CM | POA: Diagnosis not present

## 2022-05-13 DIAGNOSIS — Z131 Encounter for screening for diabetes mellitus: Secondary | ICD-10-CM | POA: Diagnosis not present

## 2022-09-10 ENCOUNTER — Other Ambulatory Visit: Payer: Self-pay | Admitting: Family Medicine

## 2022-09-10 DIAGNOSIS — F988 Other specified behavioral and emotional disorders with onset usually occurring in childhood and adolescence: Secondary | ICD-10-CM

## 2022-09-10 MED ORDER — LISDEXAMFETAMINE DIMESYLATE 60 MG PO CAPS: 60.0000 mg | ORAL_CAPSULE | ORAL | 0 refills | Status: DC

## 2022-10-27 ENCOUNTER — Other Ambulatory Visit: Payer: Self-pay | Admitting: Family Medicine

## 2022-10-27 DIAGNOSIS — F988 Other specified behavioral and emotional disorders with onset usually occurring in childhood and adolescence: Secondary | ICD-10-CM

## 2022-10-27 MED ORDER — LISDEXAMFETAMINE DIMESYLATE 60 MG PO CAPS: 60.0000 mg | ORAL_CAPSULE | ORAL | 0 refills | Status: DC

## 2022-10-27 NOTE — Telephone Encounter (Signed)
Last OV: 05/05/22 Next OV: 11/05/22 Last RF: 09/10/22

## 2022-11-05 ENCOUNTER — Encounter: Payer: Self-pay | Admitting: Family Medicine

## 2022-11-05 ENCOUNTER — Ambulatory Visit (INDEPENDENT_AMBULATORY_CARE_PROVIDER_SITE_OTHER): Payer: BC Managed Care – PPO | Admitting: Family Medicine

## 2022-11-05 VITALS — BP 112/71 | HR 67 | Ht 66.0 in | Wt 173.0 lb

## 2022-11-05 DIAGNOSIS — R202 Paresthesia of skin: Secondary | ICD-10-CM | POA: Diagnosis not present

## 2022-11-05 DIAGNOSIS — F988 Other specified behavioral and emotional disorders with onset usually occurring in childhood and adolescence: Secondary | ICD-10-CM | POA: Diagnosis not present

## 2022-11-05 DIAGNOSIS — R200 Anesthesia of skin: Secondary | ICD-10-CM

## 2022-11-05 MED ORDER — LISDEXAMFETAMINE DIMESYLATE 60 MG PO CAPS: 60.0000 mg | ORAL_CAPSULE | ORAL | 0 refills | Status: DC

## 2022-11-05 NOTE — Progress Notes (Signed)
Adila Gonzaga - 37 y.o. female MRN [id number]  Date of birth: Sep 02, 1985  Subjective Chief Complaint  Patient presents with   ADHD    HPI Anniece Deperalta is a 37 y.o. female here today for follow up of ADD.   She reports that she is doing well with Vyvanse 60mg  daily.  She has not noted side effects at current strnegth.  She is sleeping well.  She denies palpitations, headache or changes to mood.  Weight and BP are stable.   She has noted some numbness on the inside of her R foot.  She is on her feet quite a bit for her job.  She does typically wear athletic shoes but sometimes wears flats or sandals.  She has not noticed any particular shoe worsening her symptoms.  The numbness does subside after getting off her feet for a while.   ROS:  A comprehensive ROS was completed and negative except as noted per HPI  No Known Allergies  Past Medical History:  Diagnosis Date   ADHD    Asthma    H/O: 1 miscarriage     Past Surgical History:  Procedure Laterality Date   DILATION AND CURETTAGE OF UTERUS     INDUCED ABORTION     x 2.   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    Comment: straight   Other Topics Concern   Not on file  Social History Narrative   Fulltime: Occupational hygienist      Hobbies: Read, travel   Social Determinants of Health   Financial  Resource Strain: Not on file  Food Insecurity: Not on file  Transportation Needs: Not on file  Physical Activity: Not on file  Stress: Not on file  Social Connections: Unknown (08/11/2021)   Received [facility]   Social Network    Social Network: Not on file    Family History  Problem Relation Age of Onset   Diabetes Mother    Anemia Mother    Breast cancer Mother        1   ADD / ADHD Sister    Heart failure Maternal Grandmother     Health Maintenance  Topic Date Due   COVID-19 Vaccine (3 - Pfizer risk series) 05/22/2023 (Originally 10/01/2020)   INFLUENZA VACCINE  06/28/2023 (Originally 10/29/2022)   PAP SMEAR-Modifier  06/14/2023   DTaP/Tdap/Td (2 - Td or Tdap) 01/07/2028   HIV Screening  Completed   HPV VACCINES  Aged Out   Hepatitis C Screening  Discontinued     ----------------------------------------------------------------------------------------------------------------------------------------------------------------------------------------------------------------- Physical Exam BP 112/71 (BP Location: Left Arm, Patient Position: Sitting, Cuff Size: Normal)   Pulse 67   Ht 5'  6" (1.676 m)   Wt 173 lb (78.5 kg)   SpO2 100%   BMI 27.92 kg/m   Physical Exam Constitutional:      Appearance: Normal appearance.  Eyes:     General: No scleral icterus. Cardiovascular:     Rate and Rhythm: Normal rate and regular rhythm.     Pulses:          Dorsalis pedis pulses are 2+ on the right side and 2+ on the left side.       Posterior tibial pulses are 2+ on the right side and 2+ on the left side.  Pulmonary:     Effort: Pulmonary effort is normal.     Breath sounds: Normal breath sounds.  Musculoskeletal:     Cervical back: Neck supple.     Right foot: Normal range of motion. No deformity.     Left foot: Normal range of motion. No deformity.  Feet:     Right foot:     Protective Sensation: 4 sites tested.  4 sites sensed.     Skin integrity: No ulcer,  blister, skin breakdown, erythema, warmth, callus or dry skin.     Left foot:     Protective Sensation: 4 sites tested.  4 sites sensed.     Skin integrity: No ulcer, blister, skin breakdown, erythema, warmth, callus or dry skin.     Comments: Mildly positive tinel at tarsal tunnel Neurological:     Mental Status: She is alert.  Psychiatric:        Mood and Affect: Mood normal.        Behavior: Behavior normal.     ------------------------------------------------------------------------------------------------------------------------------------------------------------------------------------------------------------------- Assessment and Plan  Attention deficit disorder (ADD) in adult She is doing well with Vyvanse at current strength.  Will plan to continue at current strength.  PDMP reviewed. F/u in 6 months.   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  She will try changing her footwear and see if this helps.    Meds ordered this encounter  Medications   lisdexamfetamine (VYVANSE) 60 MG capsule    Sig: Take 1 capsule (60 mg total) by mouth every morning.    Dispense:  30 capsule    Refill:  0   lisdexamfetamine (VYVANSE) 60 MG capsule    Sig: Take 1 capsule (60 mg total) by mouth every morning.    Dispense:  30 capsule    Refill:  0   lisdexamfetamine (VYVANSE) 60 MG capsule    Sig: Take 1 capsule (60 mg total) by mouth every morning.    Dispense:  30 capsule    Refill:  0    Return in about 6 months (around 05/08/2023) for F/u ADD-Ok for in person or virtual.    This visit occurred during the SARS-CoV-2 public health emergency.  Safety protocols were in place, including screening questions prior to the visit, additional usage of staff PPE, and extensive cleaning of exam room while observing appropriate contact time as indicated for disinfecting solutions.

## 2022-11-05 NOTE — Assessment & Plan Note (Signed)
She is doing well with Vyvanse at current strength.  Will plan to continue at current strength.  PDMP reviewed. F/u in 6 months.

## 2022-11-05 NOTE — Assessment & Plan Note (Signed)
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  She will try changing her footwear and see if this helps.

## 2022-12-08 ENCOUNTER — Other Ambulatory Visit: Payer: Self-pay | Admitting: Family Medicine

## 2022-12-08 DIAGNOSIS — F988 Other specified behavioral and emotional disorders with onset usually occurring in childhood and adolescence: Secondary | ICD-10-CM

## 2022-12-09 MED ORDER — LISDEXAMFETAMINE DIMESYLATE 60 MG PO CAPS: 60.0000 mg | ORAL_CAPSULE | ORAL | 0 refills | Status: DC

## 2023-05-13 ENCOUNTER — Telehealth: Payer: BC Managed Care – PPO | Admitting: Family Medicine

## 2023-05-13 ENCOUNTER — Encounter: Payer: Self-pay | Admitting: Family Medicine

## 2023-05-13 DIAGNOSIS — F988 Other specified behavioral and emotional disorders with onset usually occurring in childhood and adolescence: Secondary | ICD-10-CM

## 2023-05-13 MED ORDER — LISDEXAMFETAMINE DIMESYLATE 60 MG PO CAPS: 60.0000 mg | ORAL_CAPSULE | ORAL | 0 refills | Status: DC

## 2023-05-13 NOTE — Progress Notes (Signed)
Hannah Martin - 38 y.o. female MRN [id number]  Date of birth: December 02, 1985   This visit type was conducted due to national recommendations for restrictions regarding the COVID-19 Pandemic (e.g. social distancing).  This format is felt to be most appropriate for this patient at this time.  All issues noted in this document were discussed and addressed.  No physical exam was performed (except for noted visual exam findings with Video Visits).  I discussed the limitations of evaluation and management by telemedicine and the availability of in person appointments. The patient expressed understanding and agreed to proceed.  I connected withNAME@ on 05/13/23 at 11:30 AM EST by a video enabled telemedicine application and verified that I am speaking with the correct person using two identifiers.  Present at visit: Everrett Coombe, DO [name]   Patient Location: Home   [address] [place] [address]   Provider location:   [facility]  Chief Complaint  Patient presents with   ADD    HPI  Hannah Martin is a 38 y.o. female who presents via audio/video conferencing for a telehealth visit today.  She reports that she is doing well with Vyvanse at current strength.  She has not had side effects with medication at current strength. She is sleeping well.  Appetite is normal.  No increased anxiety.    ROS:  A comprehensive ROS was completed and negative except as noted per HPI  Past Medical History:  Diagnosis Date   ADHD    Asthma    H/O: 1 miscarriage     Past Surgical History:  Procedure Laterality Date   DILATION AND CURETTAGE OF UTERUS     INDUCED ABORTION     x 2.   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    Family History  Problem Relation Age of Onset   Diabetes Mother    Anemia Mother    Breast cancer Mother        86   ADD / ADHD Sister    Heart failure Maternal Grandmother     Social History   Socioeconomic History   Marital status:  Single    Spouse name: Not on file   Number of children: Not on file   Years of education: Not on file   Highest education level: Not on file  Occupational History   Not on file  Tobacco Use   Smoking status: Never   Smokeless tobacco: Never  Vaping Use   Vaping status: Former   Quit date: 03/30/2018   Devices: vaped for about 1 1/2 years  Substance and Sexual Activity   Alcohol use: Yes    Comment: 3 per week    Drug use: Yes    Types: Marijuana    Comment: daily   Sexual activity: Yes    Birth control/protection: I.U.D.    Comment: straight   Other Topics Concern   Not on file  Social History Narrative   Fulltime: Occupational hygienist      Hobbies: Read, travel   Social Drivers of Corporate investment banker Strain: Not on file  Food Insecurity: Not on file  Transportation Needs: Not on file  Physical Activity: Not on file  Stress: Not on file  Social Connections: Unknown (08/11/2021)   [facility]   Social Network    Social Network: Not on file  Intimate Partner Violence: Unknown (07/03/2021)   [facility]   HITS    Physically Hurt: Not on file    Insult or Talk Down To: Not on file    Threaten Physical Harm: Not on file    Scream or Curse: Not on file     Current Outpatient Medications:    albuterol (VENTOLIN HFA) 108 (90 Base) MCG/ACT inhaler, INHALE 1 TO 2 PUFFS INTO LUNGS EVERY 6 HOURS AS NEEDED FOR WHEEZING OR SHORTNESS OF BREATH, Disp: 18 g, Rfl: 0   levonorgestrel (KYLEENA) 19.5 MG IUD, 1 each by Intrauterine route once., Disp: , Rfl:    [START ON 07/12/2023] lisdexamfetamine (VYVANSE) 60 MG capsule, Take 1 capsule (60 mg total) by mouth every morning., Disp: 30 capsule, Rfl: 0   [START ON 06/12/2023] lisdexamfetamine (VYVANSE) 60 MG capsule, Take 1 capsule (60 mg total) by mouth every morning., Disp: 30 capsule, Rfl: 0   lisdexamfetamine (VYVANSE) 60 MG capsule, Take 1 capsule (60 mg total) by mouth  every morning., Disp: 30 capsule, Rfl: 0  EXAM:  VITALS per patient if applicable: Ht 5\' 6"  (1.676 m)   Wt 173 lb (78.5 kg)   BMI 27.92 kg/m   GENERAL: alert, oriented, appears well and in no acute distress  HEENT: atraumatic, conjunttiva clear, no obvious abnormalities on inspection of external nose and ears  NECK: normal movements of the head and neck  LUNGS: on inspection no signs of respiratory distress, breathing rate appears normal, no obvious gross SOB, gasping or wheezing  CV: no obvious cyanosis  MS: moves all visible extremities without noticeable abnormality  PSYCH/NEURO: pleasant and cooperative, no obvious depression or anxiety, speech and thought processing grossly intact  ASSESSMENT AND PLAN:  Discussed the following assessment and plan:  Attention deficit disorder (ADD) in adult She is doing well with Vyvanse at current strength.  Will plan to continue at current strength.  PDMP reviewed. F/u in 6 months.      I discussed the assessment and treatment plan with the patient. The patient was provided an opportunity to ask questions and all were answered. The patient agreed with the plan and demonstrated an understanding of the instructions.   The patient was advised to call back or seek an in-person evaluation if the symptoms worsen or if the condition fails to improve as anticipated.    Everrett Coombe, DO

## 2023-05-13 NOTE — Assessment & Plan Note (Signed)
She is doing well with Vyvanse at current strength.  Will plan to continue at current strength.  PDMP reviewed. F/u in 6 months.

## 2023-05-20 ENCOUNTER — Encounter: Payer: Self-pay | Admitting: Family Medicine

## 2023-05-20 ENCOUNTER — Ambulatory Visit (INDEPENDENT_AMBULATORY_CARE_PROVIDER_SITE_OTHER): Payer: BC Managed Care – PPO | Admitting: Family Medicine

## 2023-05-20 VITALS — BP 122/76 | HR 75 | Ht 66.0 in | Wt 175.0 lb

## 2023-05-20 DIAGNOSIS — H1031 Unspecified acute conjunctivitis, right eye: Secondary | ICD-10-CM

## 2023-05-20 MED ORDER — MOXIFLOXACIN HCL 0.5 % OP SOLN: 1.0000 [drp] | Freq: Three times a day (TID) | OPHTHALMIC | 0 refills | Status: AC

## 2023-05-20 NOTE — Assessment & Plan Note (Signed)
 Visual acuity is normal.  No red flags including pain or photophobia.  Symptoms improved since removing false lashes.  Advised to avoid contact lens use until resolved and avoid sleeping in contacts.  Adding vigamox drops.  Red flags reviewed.

## 2023-05-20 NOTE — Patient Instructions (Signed)
 Bacterial Conjunctivitis, Adult  Bacterial conjunctivitis is an infection of your conjunctiva. This is the clear membrane that covers the white part of your eye and the inner part of your eyelid. This infection can make your eye:  Red or pink.  Itchy or irritated.  This condition spreads easily from person to person (is contagious) and from one eye to the other eye.  What are the causes?  This condition is caused by germs (bacteria). You may get the infection if you come into close contact with:  A person who has the infection.  Items that have germs on them (are contaminated), such as face towels, contact lens solution, or eye makeup.  What increases the risk?  You are more likely to get this condition if:  You have contact with people who have the infection.  You wear contact lenses.  You have a sinus infection.  You have had a recent eye injury or surgery.  You have a weak body defense system (immune system).  You have dry eyes.  What are the signs or symptoms?    Thick, yellowish discharge from the eye.  Tearing or watery eyes.  Itchy eyes.  Burning feeling in your eyes.  Eye redness.  Swollen eyelids.  Blurred vision.  How is this treated?    Antibiotic eye drops or ointment.  Antibiotic medicine taken by mouth. This is used for infections that do not get better with drops or ointment or that last more than 10 days.  Cool, wet cloths placed on the eyes.  Artificial tears used 2-6 times a day.  Follow these instructions at home:  Medicines  Take or apply your antibiotic medicine as told by your doctor. Do not stop using it even if you start to feel better.  Take or apply over-the-counter and prescription medicines only as told by your doctor.  Do not touch your eyelid with the eye-drop bottle or the ointment tube.  Managing discomfort  Wipe any fluid from your eye with a warm, wet washcloth or a cotton ball.  Place a clean, cool, wet cloth on your eye. Do this for 10-20 minutes, 3-4 times a day.  General  instructions  Do not wear contacts until the infection is gone. Wear glasses until your doctor says it is okay to wear contacts again.  Do not wear eye makeup until the infection is gone. Throw away old eye makeup.  Change or wash your pillowcase every day.  Do not share towels or washcloths.  Wash your hands often with soap and water for at least 20 seconds and especially before touching your face or eyes. Use paper towels to dry your hands.  Do not touch or rub your eyes.  Do not drive or use heavy machinery if your vision is blurred.  Contact a doctor if:  You have a fever.  You do not get better after 10 days.  Get help right away if:  You have a fever and your symptoms get worse all of a sudden.  You have very bad pain when you move your eye.  Your face:  Hurts.  Is red.  Is swollen.  You have sudden loss of vision.  Summary  Bacterial conjunctivitis is an infection of your conjunctiva.  This infection spreads easily from person to person.  Wash your hands often with soap and water for at least 20 seconds and especially before touching your face or eyes. Use paper towels to dry your hands.  Take or apply your  antibiotic medicine as told by your doctor.  Contact a doctor if you have a fever or you do not get better after 10 days.  This information is not intended to replace advice given to you by your health care provider. Make sure you discuss any questions you have with your health care provider.  Document Revised: 06/26/2020 Document Reviewed: 06/26/2020  Elsevier Patient Education  2024 [employer].

## 2023-05-20 NOTE — Progress Notes (Signed)
 Hannah Martin - 38 y.o. female MRN [id number]  Date of birth: [birth year]  Subjective Chief Complaint  Patient presents with   Ear Drainage    HPI Hannah Martin is a 38 y.o. female here today with complaint of eye irritation.  This started a couple of days ago.  She has some redness and itching of the eye.  She denies pain, no photosensitivity.  She has not noted vision changes.  She does wear contact lenses and sleeps in these sometimes.  She recently got new false lashes and symptoms improved after removing these.   ROS:  A comprehensive ROS was completed and negative except as noted per HPI  No Known Allergies  Past Medical History:  Diagnosis Date   ADHD    Asthma    H/O: 1 miscarriage     Past Surgical History:  Procedure Laterality Date   DILATION AND CURETTAGE OF UTERUS     INDUCED ABORTION     x 2.   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    Comment: straight   Other Topics Concern   Not on file  Social History Narrative   Fulltime: Occupational hygienist      Hobbies: Read, travel   Social Drivers of Health   Financial Resource Strain: Not on file  Food Insecurity: Not on file  Transportation Needs: Not on file  Physical Activity: Not on file  Stress: Not on file  Social Connections: Unknown (08/11/2021)   Received from [facility],  [facility]   Social Network    Social Network: Not on file    Family History  Problem Relation Age of Onset   Diabetes Mother    Anemia Mother    Breast cancer Mother        84   ADD / ADHD Sister    Heart failure Maternal Grandmother     Health Maintenance  Topic Date Due   Pneumococcal Vaccine 28-35 Years old (1 of 2 - PCV) Never done   COVID-19 Vaccine (3 - Pfizer risk series) 05/22/2023 (Originally 10/01/2020)   INFLUENZA VACCINE  06/28/2023 (Originally 10/29/2022)   Cervical Cancer Screening (HPV/Pap Cotest)  06/13/2025   DTaP/Tdap/Td (2 - Td or Tdap) 01/07/2028   HIV Screening  Completed   HPV VACCINES  Aged Out   Hepatitis C Screening  Discontinued     ----------------------------------------------------------------------------------------------------------------------------------------------------------------------------------------------------------------- Physical Exam BP 122/76 (BP Location: Left Arm, Patient Position: Sitting, Cuff Size: Normal)   Pulse 75   Ht 5\' 6"  (1.676 m)   Wt 175 lb (79.4 kg)   SpO2 98%   BMI 28.25 kg/m   Physical Exam Constitutional:      Appearance: Normal  appearance.  Eyes:     General: No scleral icterus.    Extraocular Movements: Extraocular movements intact.     Conjunctiva/sclera: Conjunctivae normal.     Pupils: Pupils are equal, round, and reactive to light.     Comments: Clear discharge from R eye.  No foreign body note.   Cardiovascular:     Rate and Rhythm: Normal rate and regular rhythm.  Neurological:     Mental Status: She is alert.     ------------------------------------------------------------------------------------------------------------------------------------------------------------------------------------------------------------------- Assessment and Plan  Conjunctivitis Visual acuity is normal.  No red flags including pain or photophobia.  Symptoms improved since removing false lashes.  Advised to  avoid contact lens use until resolved and avoid sleeping in contacts.  Adding vigamox drops.  Red flags reviewed.    Meds ordered this encounter  Medications   moxifloxacin (VIGAMOX) 0.5 % ophthalmic solution    Sig: Place 1 drop into the right eye 3 (three) times daily for 7 days.    Dispense:  3 mL    Refill:  0    No follow-ups on file.    This visit occurred during the SARS-CoV-2 public health emergency.  Safety protocols were in place, including screening questions prior to the visit, additional usage of staff PPE, and extensive cleaning of exam room while observing appropriate contact time as indicated for disinfecting solutions.

## 2023-05-21 ENCOUNTER — Telehealth: Payer: Self-pay

## 2023-05-21 NOTE — Telephone Encounter (Signed)
 Copied from CRM [id number]. Topic: Clinical - Prescription Issue >> May 20, 2023 10:26 AM Nila Nephew wrote: Reason for CRM: Patient calling in to request PA be submitted for her Vyvanse.

## 2023-05-25 NOTE — Telephone Encounter (Signed)
 Prior auth for: [facility] 60 MG Determination: APPROVED Auth #Eulis Foster / [id number] Valid from: 04/24/23 - 05/23/24 Patient notified via MyChart

## 2023-05-27 DIAGNOSIS — Z131 Encounter for screening for diabetes mellitus: Secondary | ICD-10-CM | POA: Diagnosis not present

## 2023-05-27 DIAGNOSIS — E559 Vitamin D deficiency, unspecified: Secondary | ICD-10-CM | POA: Diagnosis not present

## 2023-05-27 DIAGNOSIS — Z13228 Encounter for screening for other metabolic disorders: Secondary | ICD-10-CM | POA: Diagnosis not present

## 2023-05-27 DIAGNOSIS — Z01419 Encounter for gynecological examination (general) (routine) without abnormal findings: Secondary | ICD-10-CM | POA: Diagnosis not present

## 2023-05-27 DIAGNOSIS — Z1321 Encounter for screening for nutritional disorder: Secondary | ICD-10-CM | POA: Diagnosis not present

## 2023-05-27 DIAGNOSIS — Z13220 Encounter for screening for lipoid disorders: Secondary | ICD-10-CM | POA: Diagnosis not present

## 2023-06-08 DIAGNOSIS — F4323 Adjustment disorder with mixed anxiety and depressed mood: Secondary | ICD-10-CM | POA: Diagnosis not present

## 2023-06-15 DIAGNOSIS — F4323 Adjustment disorder with mixed anxiety and depressed mood: Secondary | ICD-10-CM | POA: Diagnosis not present

## 2023-07-09 DIAGNOSIS — Z113 Encounter for screening for infections with a predominantly sexual mode of transmission: Secondary | ICD-10-CM | POA: Diagnosis not present

## 2023-09-09 ENCOUNTER — Other Ambulatory Visit: Payer: Self-pay | Admitting: Family Medicine

## 2023-09-09 DIAGNOSIS — F988 Other specified behavioral and emotional disorders with onset usually occurring in childhood and adolescence: Secondary | ICD-10-CM

## 2023-09-09 NOTE — Telephone Encounter (Signed)
 Last filled 07/12/2023  Last OV 05/20/2023

## 2023-09-09 NOTE — Telephone Encounter (Signed)
 Last Fill: 07/12/23 30 caps/0 RF  Last OV: 05/20/23 Next OV: None Scheduled  Routing to provider for review/authorization.

## 2023-09-09 NOTE — Telephone Encounter (Signed)
 Copied from CRM [id number]. Topic: Clinical - Medication Refill >> Sep 09, 2023  9:32 AM Kevelyn M wrote: Medication: lisdexamfetamine (VYVANSE ) 60 MG capsule  Has the patient contacted their pharmacy? Yes (Agent: If no, request that the patient contact the pharmacy for the refill. If patient does not wish to contact the pharmacy document the reason why and proceed with request.) (Agent: If yes, when and what did the pharmacy advise?)  This is the patient's preferred pharmacy:  [facility] [address] [place] - [address] [place] [address] Phone: [phone] Fax: [phone]  Is this the correct pharmacy for this prescription? Yes If no, delete pharmacy and type the correct one.   Has the prescription been filled recently? No  Is the patient out of the medication? Yes  Has the patient been seen for an appointment in the last year OR does the patient have an upcoming appointment? Yes  Can we respond through MyChart? Yes  Agent: Please be advised that Rx refills may take up to 3 business days. We ask that you follow-up with your pharmacy.

## 2023-09-10 MED ORDER — LISDEXAMFETAMINE DIMESYLATE 60 MG PO CAPS: 60.0000 mg | ORAL_CAPSULE | ORAL | 0 refills | Status: DC

## 2023-09-14 ENCOUNTER — Other Ambulatory Visit: Payer: Self-pay

## 2023-09-14 DIAGNOSIS — F988 Other specified behavioral and emotional disorders with onset usually occurring in childhood and adolescence: Secondary | ICD-10-CM

## 2023-09-14 MED ORDER — LISDEXAMFETAMINE DIMESYLATE 60 MG PO CAPS: 60.0000 mg | ORAL_CAPSULE | ORAL | 0 refills | Status: DC

## 2023-09-14 NOTE — Telephone Encounter (Signed)
 Copied from CRM [id number]. Topic: Clinical - Prescription Issue >> Sep 14, 2023 10:45 AM Tisa Forester wrote: Reason for CRM: patient request for the  lisdexamfetamine (VYVANSE ) 60 MG capsule to go to a new pharmacy her insurance is not working on the medication and the coupon she has can only be used at [facility]  [facility] [address] [place] [address] Phone number : [phone] >> Sep 14, 2023 10:49 AM Tisa Forester wrote: Patient is not using the [facility] pharmacy for the refill prescription

## 2023-09-14 NOTE — Telephone Encounter (Signed)
 Patient requesting Vyvanse  60mg  to new pharmacy ( [facility] [place] [facility])  Last written 09/10/2023 ( but needing to be sent to new pharmacy)  Last OV 05/20/2023 Upcoming appt =none

## 2023-10-28 ENCOUNTER — Other Ambulatory Visit: Payer: Self-pay

## 2023-10-28 DIAGNOSIS — F988 Other specified behavioral and emotional disorders with onset usually occurring in childhood and adolescence: Secondary | ICD-10-CM

## 2023-10-28 MED ORDER — LISDEXAMFETAMINE DIMESYLATE 60 MG PO CAPS: 60.0000 mg | ORAL_CAPSULE | ORAL | 0 refills | Status: DC

## 2023-10-28 NOTE — Telephone Encounter (Signed)
 Last fill 09/14/23 last visit 05/13/23 no upcoming visit

## 2023-10-28 NOTE — Telephone Encounter (Signed)
 Copied from CRM [id number]. Topic: Clinical - Prescription Issue >> Oct 28, 2023  2:11 PM Marda MATSU wrote: Patient Dunphy calling regarding: lisdexamfetamine (VYVANSE ) 60 MG capsule set   It is at [facility] but  Can you please send to: [id number] [place], [place] - [address] [place] [address] Phone: [phone] Fax: [phone] Hours: Not open 24 hours

## 2023-10-29 ENCOUNTER — Telehealth: Payer: Self-pay

## 2023-10-29 NOTE — Telephone Encounter (Signed)
 Task completed by the covering provider on 10/28/23. No further action is required.

## 2023-10-29 NOTE — Telephone Encounter (Signed)
Called patient and she is scheduled for an appointment.

## 2023-10-29 NOTE — Telephone Encounter (Signed)
 Vyvanse  was refilled yesterday by provider.  Message sent to patient via Mychart  to inform patient.

## 2023-10-29 NOTE — Telephone Encounter (Signed)
 Copied from CRM [id number]. Topic: Clinical - Prescription Issue >> Oct 28, 2023  2:11 PM Marda MATSU wrote: Patient Dunphy calling regarding: lisdexamfetamine (VYVANSE ) 60 MG capsule set   It is at [facility] but  Can you please send to: [id number] [place], [place] - [address] [place] [address] Phone: [phone] Fax: [phone] Hours: Not open 24 hours

## 2023-10-29 NOTE — Telephone Encounter (Signed)
 Copied from CRM [id number]. Topic: Clinical - Prescription Issue >> Oct 28, 2023  2:11 PM Marda MATSU wrote: Patient Hannah Martin calling regarding: lisdexamfetamine (VYVANSE ) 60 MG capsule set   It is at [facility] but  Can you please send to: [id number] [place], [place] - [address] [place] [address] Phone: [phone] Fax: [phone] Hours: Not open 24 hours

## 2023-11-16 ENCOUNTER — Ambulatory Visit: Admitting: Family Medicine

## 2023-12-08 ENCOUNTER — Ambulatory Visit: Payer: Self-pay | Admitting: Family Medicine

## 2023-12-08 ENCOUNTER — Encounter: Payer: Self-pay | Admitting: Family Medicine

## 2023-12-08 DIAGNOSIS — F988 Other specified behavioral and emotional disorders with onset usually occurring in childhood and adolescence: Secondary | ICD-10-CM

## 2023-12-08 MED ORDER — LISDEXAMFETAMINE DIMESYLATE 70 MG PO CAPS: 70.0000 mg | ORAL_CAPSULE | ORAL | 0 refills | Status: DC

## 2023-12-08 NOTE — Progress Notes (Signed)
 Hannah Martin - 38 y.o. female MRN [id number]  Date of birth: 06/01/1985  Subjective Chief Complaint  Patient presents with   Mood    HPI Hannah Martin is a 38 y.o. female here today for follow up.  She reports that she is doing pretty well. Doesn't feel that vyvanse  at 60mg  is quite as effective as it has been.  Would like to try an increase in strength.  She is tolerating medication well at current strength.  Denies insomnia or other side effects.   ROS:  A comprehensive ROS was completed and negative except as noted per HPI   No Known Allergies  Past Medical History:  Diagnosis Date   ADHD    Asthma    H/O: 1 miscarriage     Past Surgical History:  Procedure Laterality Date   DILATION AND CURETTAGE OF UTERUS     INDUCED ABORTION     x 2.   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    Comment: straight   Other Topics Concern   Not on file  Social History Narrative   Fulltime: Occupational hygienist      Hobbies: Read, travel   Social Drivers of Health   Financial Resource Strain: Not on file  Food Insecurity: Not on file  Transportation Needs: Not on file  Physical Activity: Not on file  Stress: Not on file  Social Connections: Unknown (08/11/2021)   Received from [facility]   Social Network    Social Network: Not on file    Family  History  Problem Relation Age of Onset   Diabetes Mother    Anemia Mother    Breast cancer Mother        67   ADD / ADHD Sister    Heart failure Maternal Grandmother     Health Maintenance  Topic Date Due   Hepatitis B Vaccines 19-59 Average Risk (1 of 3 - 19+ 3-dose series) Never done   HPV VACCINES (1 - 3-dose SCDM series) Never done   Influenza Vaccine  06/27/2024 (Originally 10/29/2023)   Pneumococcal Vaccine (1 of 2 - PCV) 12/07/2024 (Originally 10/08/2006)   COVID-19 Vaccine (3 - Pfizer risk series) 12/23/2024 (Originally 10/01/2020)   Cervical Cancer Screening (HPV/Pap Cotest)  06/13/2025   DTaP/Tdap/Td (2 - Td or Tdap) 01/07/2028   HIV Screening  Completed   Meningococcal B Vaccine  Aged Out   Hepatitis C Screening  Discontinued     ----------------------------------------------------------------------------------------------------------------------------------------------------------------------------------------------------------------- Physical Exam BP 128/76 (BP Location: Left Arm, Patient Position: Sitting, Cuff Size: Normal)   Pulse 91   Ht 5' 6 (1.676 m)   Wt 200 lb (90.7 kg)   SpO2 100%  BMI 32.28 kg/m   Physical Exam Constitutional:      Appearance: Normal appearance.  Eyes:     General: No scleral icterus. Cardiovascular:     Rate and Rhythm: Normal rate and regular rhythm.  Pulmonary:     Effort: Pulmonary effort is normal.     Breath sounds: Normal breath sounds.  Neurological:     General: No focal deficit present.     Mental Status: She is alert.  Psychiatric:        Mood and Affect: Mood normal.        Behavior: Behavior normal.     ------------------------------------------------------------------------------------------------------------------------------------------------------------------------------------------------------------------- Assessment and Plan  Attention deficit disorder (ADD) in adult She is doing well with Vyvanse  but  doesn't feel like it is working as well.  Increase to 70mg /daily   PDMP reviewed. F/u in 6 months.    Meds ordered this encounter  Medications   lisdexamfetamine (VYVANSE ) 70 MG capsule    Sig: Take 1 capsule (70 mg total) by mouth every morning.    Dispense:  15 capsule    Refill:  0    Return in about 6 weeks (around 01/19/2024) for ADHD-Ok for Virtual.

## 2023-12-08 NOTE — Assessment & Plan Note (Signed)
 She is doing well with Vyvanse  but doesn't feel like it is working as well.  Increase to 70mg /daily   PDMP reviewed. F/u in 6 months.

## 2023-12-20 ENCOUNTER — Encounter: Payer: Self-pay | Admitting: Family Medicine

## 2023-12-20 DIAGNOSIS — F988 Other specified behavioral and emotional disorders with onset usually occurring in childhood and adolescence: Secondary | ICD-10-CM

## 2023-12-20 MED ORDER — LISDEXAMFETAMINE DIMESYLATE 70 MG PO CAPS: 70.0000 mg | ORAL_CAPSULE | ORAL | 0 refills | Status: DC

## 2024-01-19 ENCOUNTER — Telehealth: Payer: Self-pay | Admitting: Family Medicine

## 2024-01-31 ENCOUNTER — Encounter: Payer: Self-pay | Admitting: Family Medicine

## 2024-02-01 ENCOUNTER — Encounter: Payer: Self-pay | Admitting: Family Medicine

## 2024-02-01 DIAGNOSIS — F988 Other specified behavioral and emotional disorders with onset usually occurring in childhood and adolescence: Secondary | ICD-10-CM

## 2024-02-01 MED ORDER — LISDEXAMFETAMINE DIMESYLATE 70 MG PO CAPS: 70.0000 mg | ORAL_CAPSULE | Freq: Every day | ORAL | 0 refills | Status: AC

## 2024-02-01 MED ORDER — LISDEXAMFETAMINE DIMESYLATE 70 MG PO CAPS: 70.0000 mg | ORAL_CAPSULE | ORAL | 0 refills | Status: AC

## 2024-02-01 NOTE — Progress Notes (Signed)
 This encounter was created in error - please disregard.

## 2024-02-01 NOTE — Progress Notes (Signed)
 9049: Attempted contact. No answer.
